# Patient Record
Sex: Female | Born: 1985 | Race: White | Hispanic: No | Marital: Married | Smoking: Former smoker
Health system: Southern US, Community
[De-identification: ages and names within clinical notes are randomized; demographics above are authoritative.]

## PROBLEM LIST (undated history)

## (undated) DIAGNOSIS — T7840XA Allergy, unspecified, initial encounter: Secondary | ICD-10-CM

## (undated) DIAGNOSIS — D649 Anemia, unspecified: Secondary | ICD-10-CM

## (undated) DIAGNOSIS — F909 Attention-deficit hyperactivity disorder, unspecified type: Secondary | ICD-10-CM

## (undated) DIAGNOSIS — F419 Anxiety disorder, unspecified: Secondary | ICD-10-CM

## (undated) DIAGNOSIS — O24419 Gestational diabetes mellitus in pregnancy, unspecified control: Secondary | ICD-10-CM

## (undated) DIAGNOSIS — F32A Depression, unspecified: Secondary | ICD-10-CM

## (undated) HISTORY — DX: Gestational diabetes mellitus in pregnancy, unspecified control: O24.419

## (undated) HISTORY — DX: Anemia, unspecified: D64.9

## (undated) HISTORY — PX: WISDOM TOOTH EXTRACTION: SHX21

## (undated) HISTORY — DX: Attention-deficit hyperactivity disorder, unspecified type: F90.9

## (undated) HISTORY — DX: Allergy, unspecified, initial encounter: T78.40XA

## (undated) HISTORY — PX: COSMETIC SURGERY: SHX468

---

## 2016-04-07 DIAGNOSIS — G8929 Other chronic pain: Secondary | ICD-10-CM | POA: Insufficient documentation

## 2016-04-07 DIAGNOSIS — G542 Cervical root disorders, not elsewhere classified: Secondary | ICD-10-CM | POA: Insufficient documentation

## 2016-04-07 DIAGNOSIS — F411 Generalized anxiety disorder: Secondary | ICD-10-CM | POA: Insufficient documentation

## 2017-10-28 DIAGNOSIS — F3342 Major depressive disorder, recurrent, in full remission: Secondary | ICD-10-CM | POA: Insufficient documentation

## 2017-10-28 DIAGNOSIS — F339 Major depressive disorder, recurrent, unspecified: Secondary | ICD-10-CM | POA: Insufficient documentation

## 2019-08-30 ENCOUNTER — Other Ambulatory Visit: Payer: Self-pay

## 2019-08-30 DIAGNOSIS — Z20822 Contact with and (suspected) exposure to covid-19: Secondary | ICD-10-CM

## 2019-09-01 LAB — NOVEL CORONAVIRUS, NAA: SARS-CoV-2, NAA: NOT DETECTED

## 2020-02-18 ENCOUNTER — Ambulatory Visit: Payer: Medicaid Other | Attending: Internal Medicine

## 2020-02-18 DIAGNOSIS — Z23 Encounter for immunization: Secondary | ICD-10-CM | POA: Insufficient documentation

## 2020-02-18 NOTE — Progress Notes (Signed)
   Covid-19 Vaccination Clinic  Name:  Susan Suarez    MRN: [id number] DOB: 1985-12-31  02/18/2020  Susan Suarez was observed post Covid-19 immunization for 15 minutes without incidence. She was provided with Vaccine Information Sheet and instruction to access the V-Safe system.   Susan Suarez was instructed to call 911 with any severe reactions post vaccine: Marland Kitchen Difficulty breathing  . Swelling of your face and throat  . A fast heartbeat  . A bad rash all over your body  . Dizziness and weakness    Immunizations Administered    Name Date Dose VIS Date Route   Pfizer COVID-19 Vaccine 02/18/2020  3:36 PM 0.3 mL 12/02/2019 Intramuscular   Manufacturer: [place]   Lot: [id number]   [place]: [id number]

## 2020-03-10 ENCOUNTER — Ambulatory Visit: Payer: Medicaid Other | Attending: Internal Medicine

## 2020-03-10 DIAGNOSIS — Z23 Encounter for immunization: Secondary | ICD-10-CM

## 2020-03-10 NOTE — Progress Notes (Signed)
   Covid-19 Vaccination Clinic  Name:  Susan Suarez    MRN: [id number] DOB: Nov 27, 1986  03/10/2020  [name] was observed post Covid-19 immunization for 15 minutes without incident. She was provided with Vaccine Information Sheet and instruction to access the V-Safe system.   [name] was instructed to call 911 with any severe reactions post vaccine: Marland Kitchen Difficulty breathing  . Swelling of face and throat  . A fast heartbeat  . A bad rash all over body  . Dizziness and weakness   Immunizations Administered    Name Date Dose VIS Date Route   Pfizer COVID-19 Vaccine 03/10/2020  8:42 AM 0.3 mL 12/02/2019 Intramuscular   Manufacturer: [place]   Lot: [id number]   [place]: [id number]

## 2020-10-19 ENCOUNTER — Other Ambulatory Visit: Payer: Medicaid Other

## 2020-10-19 DIAGNOSIS — Z20822 Contact with and (suspected) exposure to covid-19: Secondary | ICD-10-CM

## 2020-10-20 LAB — NOVEL CORONAVIRUS, NAA: SARS-CoV-2, NAA: NOT DETECTED

## 2020-10-20 LAB — SARS-COV-2, NAA 2 DAY TAT

## 2020-12-13 ENCOUNTER — Other Ambulatory Visit: Payer: Medicaid Other

## 2021-02-12 ENCOUNTER — Encounter: Payer: Self-pay | Admitting: Radiology

## 2021-02-24 ENCOUNTER — Inpatient Hospital Stay (HOSPITAL_COMMUNITY)
Admission: AD | Admit: 2021-02-24 | Discharge: 2021-02-24 | Disposition: A | Discharge status: Home / Self Care | Payer: 59 | Attending: Obstetrics and Gynecology | Admitting: Obstetrics and Gynecology

## 2021-02-24 ENCOUNTER — Other Ambulatory Visit: Payer: Self-pay

## 2021-02-24 ENCOUNTER — Inpatient Hospital Stay (HOSPITAL_COMMUNITY): Payer: 59

## 2021-02-24 ENCOUNTER — Encounter (HOSPITAL_COMMUNITY): Payer: Self-pay

## 2021-02-24 DIAGNOSIS — O4691 Antepartum hemorrhage, unspecified, first trimester: Secondary | ICD-10-CM

## 2021-02-24 DIAGNOSIS — R109 Unspecified abdominal pain: Secondary | ICD-10-CM | POA: Insufficient documentation

## 2021-02-24 DIAGNOSIS — Z79899 Other long term (current) drug therapy: Secondary | ICD-10-CM | POA: Insufficient documentation

## 2021-02-24 DIAGNOSIS — Z3A01 Less than 8 weeks gestation of pregnancy: Secondary | ICD-10-CM | POA: Diagnosis not present

## 2021-02-24 DIAGNOSIS — O26891 Other specified pregnancy related conditions, first trimester: Secondary | ICD-10-CM | POA: Insufficient documentation

## 2021-02-24 DIAGNOSIS — O09521 Supervision of elderly multigravida, first trimester: Secondary | ICD-10-CM | POA: Insufficient documentation

## 2021-02-24 DIAGNOSIS — O3680X0 Pregnancy with inconclusive fetal viability, not applicable or unspecified: Secondary | ICD-10-CM | POA: Diagnosis not present

## 2021-02-24 DIAGNOSIS — O209 Hemorrhage in early pregnancy, unspecified: Secondary | ICD-10-CM

## 2021-02-24 HISTORY — DX: Depression, unspecified: F32.A

## 2021-02-24 HISTORY — DX: Anxiety disorder, unspecified: F41.9

## 2021-02-24 LAB — WET PREP, GENITAL
Clue Cells Wet Prep HPF POC: NONE SEEN
Sperm: NONE SEEN
Trich, Wet Prep: NONE SEEN
Yeast Wet Prep HPF POC: NONE SEEN

## 2021-02-24 LAB — HIV ANTIBODY (ROUTINE TESTING W REFLEX): HIV Screen 4th Generation wRfx: NONREACTIVE

## 2021-02-24 LAB — CBC
HCT: 41.6 % (ref 36.0–46.0)
Hemoglobin: 13.8 g/dL (ref 12.0–15.0)
MCH: 30.7 pg (ref 26.0–34.0)
MCHC: 33.2 g/dL (ref 30.0–36.0)
MCV: 92.4 fL (ref 80.0–100.0)
Platelets: 245 10*3/uL (ref 150–400)
RBC: 4.5 MIL/uL (ref 3.87–5.11)
RDW: 11.8 % (ref 11.5–15.5)
WBC: 7.4 10*3/uL (ref 4.0–10.5)
nRBC: 0 % (ref 0.0–0.2)

## 2021-02-24 LAB — URINALYSIS, ROUTINE W REFLEX MICROSCOPIC
Bacteria, UA: NONE SEEN
Bilirubin Urine: NEGATIVE
Glucose, UA: NEGATIVE mg/dL
Ketones, ur: 5 mg/dL — AB
Nitrite: NEGATIVE
Protein, ur: NEGATIVE mg/dL
Specific Gravity, Urine: 1.002 — ABNORMAL LOW (ref 1.005–1.030)
pH: 7 (ref 5.0–8.0)

## 2021-02-24 LAB — HCG, QUANTITATIVE, PREGNANCY: hCG, Beta Chain, Quant, S: 3133 m[IU]/mL — ABNORMAL HIGH (ref <5)

## 2021-02-24 NOTE — MAU Provider Note (Signed)
Chief Complaint: Vaginal Bleeding and Abdominal Pain   Event Date/Time   First Provider Initiated Contact with Patient 02/24/21 1115      SUBJECTIVE HPI: Susan Suarez is a 35 y.o. G2P1001 at [redacted]w[redacted]d by LMP who presents to maternity admissions reporting onset of light red spotting with some associated abdominal cramping. The bleeding started yesterday and was brown, then became more red today. It is light, only noted when wiping and has not required a pad.     Location: lower abdomen Quality: cramping Severity: 2/10 on pain scale Duration: 1 day Timing: intermittent Modifying factors: none Associated signs and symptoms: vaginal bleeding  HPI  Past Medical History:  Diagnosis Date  . Anxiety   . Depression    Past Surgical History:  Procedure Laterality Date  . CESAREAN SECTION    . WISDOM TOOTH EXTRACTION     Social History   Socioeconomic History  . Marital status: Married    Spouse name: Not on file  . Number of children: Not on file  . Years of education: Not on file  . Highest education level: Not on file  Occupational History  . Not on file  Tobacco Use  . Smoking status: Not on file  . Smokeless tobacco: Not on file  Substance and Sexual Activity  . Alcohol use: Not on file  . Drug use: Not on file  . Sexual activity: Not on file  Other Topics Concern  . Not on file  Social History Narrative  . Not on file   Social Determinants of Health   Financial Resource Strain: Not on file  Food Insecurity: Not on file  Transportation Needs: Not on file  Physical Activity: Not on file  Stress: Not on file  Social Connections: Not on file  Intimate Partner Violence: Not on file   No current facility-administered medications on file prior to encounter.   Current Outpatient Medications on File Prior to Encounter  Medication Sig Dispense Refill  . DULoxetine (CYMBALTA) 60 MG capsule Take 60 mg by mouth daily.    . fexofenadine (ALLEGRA ODT) 30 MG  disintegrating tablet Take 30 mg by mouth daily.    . hydrOXYzine (ATARAX/VISTARIL) 25 MG tablet Take 25 mg by mouth 3 (three) times daily as needed.    . Prenatal Vit-Fe Fumarate-FA (MULTIVITAMIN-PRENATAL) 27-0.8 MG TABS tablet Take 1 tablet by mouth daily at 12 noon.     No Known Allergies  ROS:  Review of Systems  Constitutional: Negative for chills, fatigue and fever.  Respiratory: Negative for shortness of breath.   Cardiovascular: Negative for chest pain.  Gastrointestinal: Positive for abdominal pain. Negative for nausea and vomiting.  Genitourinary: Positive for vaginal bleeding. Negative for difficulty urinating, dysuria, flank pain, pelvic pain, vaginal discharge and vaginal pain.  Neurological: Negative for dizziness and headaches.  Psychiatric/Behavioral: Negative.      I have reviewed patient's Past Medical Hx, Surgical Hx, Family Hx, Social Hx, medications and allergies.   Physical Exam   Patient Vitals for the past 24 hrs:  BP Temp Temp src Pulse Resp SpO2 Height Weight  02/24/21 1343 114/69 98.3 F (36.8 C) Oral 88 16 98 % -- --  02/24/21 1016 115/62 98.4 F (36.9 C) Oral 80 16 97 % -- --  02/24/21 0946 118/74 98.3 F (36.8 C) Oral 80 16 98 % -- --  02/24/21 0942 -- -- -- -- -- -- 5\' 2"  (1.575 m) 67.1 kg   Constitutional: Well-developed, well-nourished female in no acute distress.  Cardiovascular: normal rate Respiratory: normal effort GI: Abd soft, non-tender. Pos BS x 4 MS: Extremities nontender, no edema, normal ROM Neurologic: Alert and oriented x 4.  GU: Neg CVAT.  PELVIC EXAM: Cervix pink, visually closed, without lesion, scant brown bleeding only, vaginal walls and external genitalia normal Bimanual exam: Cervix 0/long/high, firm, anterior, neg CMT, uterus nontender, nonenlarged, adnexa without tenderness, enlargement, or mass   LAB RESULTS Results for orders placed or performed during the hospital encounter of 02/24/21 (from the past 24 hour(s))   Urinalysis, Routine w reflex microscopic Urine, Clean Catch     Status: Abnormal   Collection Time: 02/24/21 10:54 AM  Result Value Ref Range   Color, Urine STRAW (A) YELLOW   APPearance CLEAR CLEAR   Specific Gravity, Urine 1.002 (L) 1.005 - 1.030   pH 7.0 5.0 - 8.0   Glucose, UA NEGATIVE NEGATIVE mg/dL   Hgb urine dipstick MODERATE (A) NEGATIVE   Bilirubin Urine NEGATIVE NEGATIVE   Ketones, ur 5 (A) NEGATIVE mg/dL   Protein, ur NEGATIVE NEGATIVE mg/dL   Nitrite NEGATIVE NEGATIVE   Leukocytes,Ua SMALL (A) NEGATIVE   RBC / HPF 0-5 0 - 5 RBC/hpf   WBC, UA 0-5 0 - 5 WBC/hpf   Bacteria, UA NONE SEEN NONE SEEN   Squamous Epithelial / LPF 0-5 0 - 5  CBC     Status: None   Collection Time: 02/24/21 11:04 AM  Result Value Ref Range   WBC 7.4 4.0 - 10.5 K/uL   RBC 4.50 3.87 - 5.11 MIL/uL   Hemoglobin 13.8 12.0 - 15.0 g/dL   HCT 41.6 36.0 - 46.0 %   MCV 92.4 80.0 - 100.0 fL   MCH 30.7 26.0 - 34.0 pg   MCHC 33.2 30.0 - 36.0 g/dL   RDW 11.8 11.5 - 15.5 %   Platelets 245 150 - 400 K/uL   nRBC 0.0 0.0 - 0.2 %  hCG, quantitative, pregnancy     Status: Abnormal   Collection Time: 02/24/21 11:04 AM  Result Value Ref Range   hCG, Beta Chain, Quant, S 3,133 (H) <5 mIU/mL  HIV Antibody (routine testing w rflx)     Status: None   Collection Time: 02/24/21 11:04 AM  Result Value Ref Range   HIV Screen 4th Generation wRfx Non Reactive Non Reactive  ABO/Rh     Status: None   Collection Time: 02/24/21 11:04 AM  Result Value Ref Range   ABO/RH(D)      A POS Performed at [facility], [address] [place], [place] [address]   Wet prep, genital     Status: Abnormal   Collection Time: 02/24/21 11:07 AM   Specimen: Vaginal  Result Value Ref Range   Yeast Wet Prep HPF POC NONE SEEN NONE SEEN   Trich, Wet Prep NONE SEEN NONE SEEN   Clue Cells Wet Prep HPF POC NONE SEEN NONE SEEN   WBC, Wet Prep HPF POC MANY (A) NONE SEEN   Sperm NONE SEEN     --/--/A POS Performed at [facility], [place] [address], [place], [place] [address]  [phone])  IMAGING US OB LESS THAN 14 WEEKS WITH OB TRANSVAGINAL  Result Date: 02/24/2021 CLINICAL DATA:  Pregnant patient with vaginal bleeding and pain. EXAM: OBSTETRIC <14 WK [place] AND TRANSVAGINAL OB US TECHNIQUE: Both transabdominal and transvaginal ultrasound examinations were performed for complete evaluation of the gestation as well as the maternal uterus, adnexal regions, and pelvic cul-de-sac. Transvaginal  technique was performed to assess early pregnancy. COMPARISON:  None. FINDINGS: Intrauterine gestational sac: Single visualized. Yolk sac:  Structure thought to be a yolk sac is seen on image 35. Embryo:  Mild visualized. Cardiac Activity: Not applicable. MSD: 5.5 mm   5 w   2 d Subchorionic hemorrhage:  None visualized. Maternal uterus/adnexae: Normal. IMPRESSION: Findings most compatible with early IUP.  No acute abnormality. Electronically Signed   By: Inge Rise M.D.   On: 02/24/2021 13:14    MAU Management/MDM: Orders Placed This Encounter  Procedures  . Wet prep, genital  . US OB LESS THAN 14 WEEKS WITH OB TRANSVAGINAL  . Urinalysis, Routine w reflex microscopic Urine, Clean Catch  . CBC  . hCG, quantitative, pregnancy  . HIV Antibody (routine testing w rflx)  . ABO/Rh  . Discharge patient    No orders of the defined types were placed in this encounter.   Findings today could represent a normal early pregnancy, spontaneous abortion or ectopic pregnancy which can be life-threatening.  Ectopic precautions were given to the patient with plan to return in 48 hours for repeat quant hcg to evaluate pregnancy development.  There was a probable yolk sac on [place] today but not definitive so pt to f/u at [facility] on Tuesday at 10:00 am for stat hcg.  Return to MAU sooner as needed for worsening symptoms.    ASSESSMENT 1. Pregnancy of unknown anatomic location   2. Vaginal bleeding in pregnancy, first trimester   3. [redacted] weeks  gestation of pregnancy     PLAN Discharge home Allergies as of 02/24/2021   No Known Allergies     Medication List    TAKE these medications   DULoxetine 60 MG capsule Commonly known as: CYMBALTA Take 60 mg by mouth daily.   fexofenadine 30 MG disintegrating tablet Commonly known as: ALLEGRA ODT Take 30 mg by mouth daily.   hydrOXYzine 25 MG tablet Commonly known as: ATARAX/VISTARIL Take 25 mg by mouth 3 (three) times daily as needed.   multivitamin-prenatal 27-0.8 MG Tabs tablet Take 1 tablet by mouth daily at 12 noon.       Follow-up [place] for [facility] Follow up.   Specialty: Obstetrics and Gynecology Why: Follow up in 2 days, on 02/26/21, for repeat labs, return to MAU as needed for emergencies. Contact information: [place] Certified Nurse-Midwife 02/24/2021  6:32 PM

## 2021-02-24 NOTE — MAU Note (Signed)
Susan Suarez is a 35 y.o. at [redacted]w[redacted]d here in MAU reporting: started having brown discharge last night and then this AM saw a clot. The clot was bright red. Having some abdominal cramping.   LMP: 01/10/21  Onset of complaint: last night  Pain score: 2/10  Vitals:   02/24/21 0946  BP: 118/74  Pulse: 80  Resp: 16  Temp: 98.3 F (36.8 C)  SpO2: 98%     Lab orders placed from triage: UA

## 2021-02-25 LAB — GC/CHLAMYDIA PROBE AMP (~~LOC~~) NOT AT ARMC
Chlamydia: NEGATIVE
Comment: NEGATIVE
Comment: NORMAL
Neisseria Gonorrhea: NEGATIVE

## 2021-02-25 LAB — ABO/RH: ABO/RH(D): A POS

## 2021-02-26 ENCOUNTER — Telehealth: Payer: Self-pay

## 2021-02-26 ENCOUNTER — Other Ambulatory Visit: Payer: Self-pay

## 2021-02-26 ENCOUNTER — Ambulatory Visit (INDEPENDENT_AMBULATORY_CARE_PROVIDER_SITE_OTHER): Payer: 59 | Admitting: Obstetrics and Gynecology

## 2021-02-26 ENCOUNTER — Telehealth: Payer: Self-pay | Admitting: *Deleted

## 2021-02-26 ENCOUNTER — Other Ambulatory Visit: Payer: 59

## 2021-02-26 DIAGNOSIS — O039 Complete or unspecified spontaneous abortion without complication: Secondary | ICD-10-CM | POA: Diagnosis not present

## 2021-02-26 DIAGNOSIS — O3680X0 Pregnancy with inconclusive fetal viability, not applicable or unspecified: Secondary | ICD-10-CM

## 2021-02-26 DIAGNOSIS — O200 Threatened abortion: Secondary | ICD-10-CM

## 2021-02-26 DIAGNOSIS — O09521 Supervision of elderly multigravida, first trimester: Secondary | ICD-10-CM | POA: Diagnosis not present

## 2021-02-26 DIAGNOSIS — O209 Hemorrhage in early pregnancy, unspecified: Secondary | ICD-10-CM

## 2021-02-26 LAB — BETA HCG QUANT (REF LAB): hCG Quant: 773 m[IU]/mL

## 2021-02-26 NOTE — Progress Notes (Signed)
Follow up from MAU visit

## 2021-02-26 NOTE — Telephone Encounter (Signed)
Susan Suarez with [employer] called the office with the stat hcg levels. HCG Level is: 773

## 2021-02-26 NOTE — Telephone Encounter (Signed)
Pt informed of HCG resutls

## 2021-02-27 NOTE — Progress Notes (Signed)
Obstetrics and Gynecology Visit Return Patient Evaluation  Appointment Date: 02/26/2021  Primary Care Provider: [facility]: [facility] for [facility]  Chief Complaint: f/u MAU visit  History of Present Illness:  Susan Suarez is a 35 y.o. G2P1 with above CC. Patient here for 48h beta but added on for provider visit given patient questions, concerns.  Patient seen on 3/6 for VB in early pregnancy; she had a +UPT at her PCP on 2/21. She had not seen an OB provider before presenting to the MAU. Normal CBC, u/a, hiv, wet prep, gc/ct with quant of 3133 and u/s that showed a likely YS with MSD c/w 5wks 2days. A POS   Interval History: Since that time, she states that she has increased VB, ?passing tissue. No systemic s/s.   Review of Systems: as noted in the History of Present Illness.  There are no problems to display for this patient.  Medications:  Susan Suarez had no medications administered during this visit. Current Outpatient Medications  Medication Sig Dispense Refill  . DULoxetine (CYMBALTA) 60 MG capsule Take 60 mg by mouth daily.    . fexofenadine (ALLEGRA ODT) 30 MG disintegrating tablet Take 30 mg by mouth daily.    . hydrOXYzine (ATARAX/VISTARIL) 25 MG tablet Take 25 mg by mouth 3 (three) times daily as needed.    . Prenatal Vit-Fe Fumarate-FA (MULTIVITAMIN-PRENATAL) 27-0.8 MG TABS tablet Take 1 tablet by mouth daily at 12 noon.     No current facility-administered medications for this visit.    Allergies: has No Known Allergies.  Physical Exam:  LMP 01/10/2021  There is no height or weight on file to calculate BMI. General appearance: Well nourished, well developed female in no acute distress.  Abdomen: diffusely non tender to palpation, non distended, and no masses, hernias Neuro/Psych:  Normal mood and affect.    Pelvic exam:  EGBUS: old blood at the introitus  TVUS negative. Previously seen sac not  present   Assessment: s/p completed SAB  Plan:  1. Threatened abortion  2. Miscarriage D/w her re: SAB and that most likely due to being AMA and increased risk of early miscarriage with AMA. I told her that she should expct a period in the next 1-75m and if not to take a home UPT and let us know the results  I reivewed the MAU u/s images and I see a YS so she had an IUP so no need to follow serial quants. Quant today already sent to lab. Will follow up.    RTC: PRN  Durene Romans MD Attending Center for [facility])

## 2021-02-27 NOTE — Procedures (Signed)
Transvaginal Ultrasound Procedure Note  Pre-operative Diagnosis: Threatened AB in early pregnancy  Post-operative Diagnosis: SAB   Procedure Details  The patient was placed in the dorsal lithotomy position.  Old blood on the vulva. Transvaginal probe placed  Uterus: midplane. 6.9 x 3.2 x 4.1cm. Endometrial stripe: 53mm, thin, avascular, homogenous. Right ovary: 2.3 x 3 x 2cm, two sub centimeter follicles Left ovary: 3.3 x 2.5 x 1.7cm  No free fluid in the pelvis  Complications: None  Plan: Previously seen 5wk GS with YS is not seen c/w completed miscarriage  Durene Romans MD Attending Center for [facility])

## 2021-03-28 ENCOUNTER — Encounter: Payer: 59 | Admitting: Family Medicine

## 2021-05-14 DIAGNOSIS — L700 Acne vulgaris: Secondary | ICD-10-CM | POA: Insufficient documentation

## 2021-05-14 DIAGNOSIS — L719 Rosacea, unspecified: Secondary | ICD-10-CM | POA: Insufficient documentation

## 2021-09-10 ENCOUNTER — Encounter: Payer: Self-pay | Admitting: *Deleted

## 2021-10-09 ENCOUNTER — Telehealth: Payer: Self-pay

## 2021-10-09 NOTE — Telephone Encounter (Signed)
Called the pt to move the appt

## 2021-10-11 ENCOUNTER — Other Ambulatory Visit: Payer: Self-pay

## 2021-10-11 ENCOUNTER — Inpatient Hospital Stay (HOSPITAL_COMMUNITY): Payer: Medicaid Other

## 2021-10-11 ENCOUNTER — Inpatient Hospital Stay (HOSPITAL_COMMUNITY)
Admission: AD | Admit: 2021-10-11 | Discharge: 2021-10-11 | Disposition: A | Discharge status: Home / Self Care | Payer: Medicaid Other | Attending: Obstetrics and Gynecology | Admitting: Obstetrics and Gynecology

## 2021-10-11 ENCOUNTER — Encounter (HOSPITAL_COMMUNITY): Payer: Self-pay | Admitting: Obstetrics and Gynecology

## 2021-10-11 DIAGNOSIS — O209 Hemorrhage in early pregnancy, unspecified: Secondary | ICD-10-CM

## 2021-10-11 DIAGNOSIS — O09291 Supervision of pregnancy with other poor reproductive or obstetric history, first trimester: Secondary | ICD-10-CM | POA: Diagnosis not present

## 2021-10-11 DIAGNOSIS — O200 Threatened abortion: Secondary | ICD-10-CM | POA: Diagnosis not present

## 2021-10-11 DIAGNOSIS — O09521 Supervision of elderly multigravida, first trimester: Secondary | ICD-10-CM | POA: Diagnosis not present

## 2021-10-11 DIAGNOSIS — Z3A09 9 weeks gestation of pregnancy: Secondary | ICD-10-CM | POA: Diagnosis not present

## 2021-10-11 LAB — URINALYSIS, ROUTINE W REFLEX MICROSCOPIC
Bacteria, UA: NONE SEEN
Bilirubin Urine: NEGATIVE
Glucose, UA: NEGATIVE mg/dL
Ketones, ur: NEGATIVE mg/dL
Leukocytes,Ua: NEGATIVE
Nitrite: NEGATIVE
Protein, ur: NEGATIVE mg/dL
RBC / HPF: >50 RBC/hpf — ABNORMAL HIGH (ref 0–5)
Specific Gravity, Urine: 1.015 (ref 1.005–1.030)
pH: 7 (ref 5.0–8.0)

## 2021-10-11 LAB — CBC
HCT: 41.4 % (ref 36.0–46.0)
Hemoglobin: 13.7 g/dL (ref 12.0–15.0)
MCH: 30.4 pg (ref 26.0–34.0)
MCHC: 33.1 g/dL (ref 30.0–36.0)
MCV: 92 fL (ref 80.0–100.0)
Platelets: 247 10*3/uL (ref 150–400)
RBC: 4.5 MIL/uL (ref 3.87–5.11)
RDW: 12.1 % (ref 11.5–15.5)
WBC: 6.4 10*3/uL (ref 4.0–10.5)
nRBC: 0 % (ref 0.0–0.2)

## 2021-10-11 LAB — POCT PREGNANCY, URINE: Preg Test, Ur: POSITIVE — AB

## 2021-10-11 LAB — HCG, QUANTITATIVE, PREGNANCY: hCG, Beta Chain, Quant, S: 31548 m[IU]/mL — ABNORMAL HIGH (ref <5)

## 2021-10-11 NOTE — MAU Provider Note (Signed)
History     CSN: [id number]  Arrival date and time: 10/11/21 2951   Event Date/Time   First Provider Initiated Contact with Patient 10/11/21 [phone]      Chief Complaint  Patient presents with   Vaginal Bleeding   HPI Susan Suarez is a 35 y.o. G3P1011 at [redacted]w[redacted]d who presents with vaginal bleeding. Symptoms started Wednesday with brown discharge. Yesterday bleeding increased & was red. Not bleeding into a pad. Did pass 1 small blood clots yesterday. Denies abdominal pain. Concerned due to miscarriage earlier this year.   OB History     Gravida  3   Para  1   Term  1   Preterm      AB  1   Living  1      SAB  1   IAB      Ectopic      Multiple      Live Births  1           Past Medical History:  Diagnosis Date   Anxiety    Depression     Past Surgical History:  Procedure Laterality Date   CESAREAN SECTION     WISDOM TOOTH EXTRACTION      No family history on file.  Social History   Tobacco Use   Smoking status: Former    Types: Cigarettes    Quit date: 09/21/2018    Years since quitting: 3.0   Smokeless tobacco: Never  Vaping Use   Vaping Use: Never used  Substance Use Topics   Alcohol use: Not Currently   Drug use: Not Currently    Allergies: No Known Allergies  Medications Prior to Admission  Medication Sig Dispense Refill Last Dose   cetirizine (ZYRTEC) 10 MG tablet Take 10 mg by mouth daily.      DULoxetine (CYMBALTA) 60 MG capsule Take 60 mg by mouth daily.   10/11/2021   fluticasone (FLONASE) 50 MCG/ACT nasal spray Place 1 spray into both nostrils daily.      hydrOXYzine (ATARAX/VISTARIL) 25 MG tablet Take 25 mg by mouth 3 (three) times daily as needed.   10/11/2021   fexofenadine (ALLEGRA ODT) 30 MG disintegrating tablet Take 30 mg by mouth daily.      Prenatal Vit-Fe Fumarate-FA (MULTIVITAMIN-PRENATAL) 27-0.8 MG TABS tablet Take 1 tablet by mouth daily at 12 noon.       Review of Systems  Constitutional: Negative.    Gastrointestinal: Negative.   Genitourinary:  Positive for vaginal bleeding.  Physical Exam   Blood pressure 120/78, pulse 98, temperature 98.4 F (36.9 C), resp. rate 18, height 5\' 2"  (1.575 m), weight 62.1 kg, last menstrual period 08/07/2021, unknown if currently breastfeeding.  Physical Exam Vitals and nursing note reviewed. Exam conducted with a chaperone present.  Constitutional:      Appearance: Normal appearance. She is not ill-appearing.  HENT:     Head: Normocephalic and atraumatic.  Eyes:     General: No scleral icterus. Pulmonary:     Effort: Pulmonary effort is normal. No respiratory distress.  Abdominal:     General: Abdomen is flat. There is no distension.     Palpations: Abdomen is soft.     Tenderness: There is no abdominal tenderness.  Genitourinary:    Comments: Spec exam: NEFG. Small  amount of dark red mucoid blood. No abnormal discharge. Cervix anterior to patient's left; no lesions & not friable. Cervix closed.  Skin:    General: Skin is warm and dry.  Neurological:     General: No focal deficit present.     Mental Status: She is alert.  Psychiatric:        Mood and Affect: Mood normal.        Behavior: Behavior normal.    MAU Course  Procedures Results for orders placed or performed during the hospital encounter of 10/11/21 (from the past 24 hour(s))  Pregnancy, urine POC     Status: Abnormal   Collection Time: 10/11/21  9:32 AM  Result Value Ref Range   Preg Test, Ur POSITIVE (A) NEGATIVE  Urinalysis, Routine w reflex microscopic Urine, Clean Catch     Status: Abnormal   Collection Time: 10/11/21  9:38 AM  Result Value Ref Range   Color, Urine YELLOW YELLOW   APPearance HAZY (A) CLEAR   Specific Gravity, Urine 1.015 1.005 - 1.030   pH 7.0 5.0 - 8.0   Glucose, UA NEGATIVE NEGATIVE mg/dL   Hgb urine dipstick MODERATE (A) NEGATIVE   Bilirubin Urine NEGATIVE NEGATIVE   Ketones, ur NEGATIVE NEGATIVE mg/dL   Protein, ur NEGATIVE NEGATIVE mg/dL    Nitrite NEGATIVE NEGATIVE   Leukocytes,Ua NEGATIVE NEGATIVE   RBC / HPF >50 (H) 0 - 5 RBC/hpf   WBC, UA 6-10 0 - 5 WBC/hpf   Bacteria, UA NONE SEEN NONE SEEN   Squamous Epithelial / LPF 0-5 0 - 5   Mucus PRESENT   CBC     Status: None   Collection Time: 10/11/21 10:04 AM  Result Value Ref Range   WBC 6.4 4.0 - 10.5 K/uL   RBC 4.50 3.87 - 5.11 MIL/uL   Hemoglobin 13.7 12.0 - 15.0 g/dL   HCT 41.4 36.0 - 46.0 %   MCV 92.0 80.0 - 100.0 fL   MCH 30.4 26.0 - 34.0 pg   MCHC 33.1 30.0 - 36.0 g/dL   RDW 12.1 11.5 - 15.5 %   Platelets 247 150 - 400 K/uL   nRBC 0.0 0.0 - 0.2 %  hCG, quantitative, pregnancy     Status: Abnormal   Collection Time: 10/11/21 10:04 AM  Result Value Ref Range   hCG, Beta Chain, Quant, S 31,548 (H) <5 mIU/mL   US OB LESS THAN 14 WEEKS WITH OB TRANSVAGINAL  Result Date: 10/11/2021 CLINICAL DATA:  Bleeding for 24 hours EXAM: OBSTETRIC <14 WK [place] AND TRANSVAGINAL OB US TECHNIQUE: Both transabdominal and transvaginal ultrasound examinations were performed for complete evaluation of the gestation as well as the maternal uterus, adnexal regions, and pelvic cul-de-sac. Transvaginal technique was performed to assess early pregnancy. COMPARISON:  Obstetric ultrasound from a prior pregnancy 02/24/2021 FINDINGS: Intrauterine gestational sac: Single Yolk sac: Visualized. The yolk sac is prominent in size measuring up to 1.0 cm. Embryo:  Visualized. Cardiac Activity: Not Visualized. Heart Rate: N/a bpm CRL: 2.8 mm   5 w   5 d                  [place] EDC: 06/08/2022 Subchorionic hemorrhage:  None visualized. Maternal uterus/adnexae: A corpus luteum is noted on the right. The left ovary is unremarkable. IMPRESSION: Findings are suspicious but not yet definitive for failed pregnancy. Recommend follow-up US in 10-14 days for definitive diagnosis. This recommendation follows SRU consensus guidelines: Diagnostic Criteria for Nonviable Pregnancy Early in the First Trimester. [place] Med 2013;  220:2542-70. Electronically Signed   By: Valetta Mole M.D.   On: 10/11/2021 11:27    MDM +UPT UA, CBC, ABO/Rh, quant hCG, and  [place] today to rule out ectopic pregnancy which can be life threatening.   Patient presents with vaginal bleeding. Minimal bleeding on exam. She is RH positive.  Ultrasound today shows IUGS with enlarged yolk sac & 2.8 mm fetal pole, no cardiac activity. Finding concerning but not diagnostic of failed pregnancy. Discussed result with patient & recommend f/u ultrasound for diagnosis. Patient is agreeable with this plan but would like an HCG on Monday. Discussed with patient that HCG would only give [place] diagnosis if it starts dropping.  Assessment and Plan   1. Threatened miscarriage   2. Vaginal bleeding in pregnancy, first trimester    -bleeding precautions -pelvic rest -outpatient viability scan ordered -HCG scheduled Monday in the office (per patient's request, she would like to know if HCG is dropping)    Jorje Guild 10/11/2021, 9:53 AM

## 2021-10-11 NOTE — MAU Note (Signed)
Pt is 9 weeks.Pt stated she had brown discharge on Wed. Yesterday she had red bleeding and thought she passed some tissue.  Still has bleeding and cramping. Pt had miscarriage in March.

## 2021-10-11 NOTE — Discharge Instructions (Signed)
Return to care  If you have heavier bleeding that soaks through more than 2 pads per hour for an hour or more If you bleed so much that you feel like you might pass out or you do pass out If you have significant abdominal pain that is not improved with Tylenol   

## 2021-10-14 ENCOUNTER — Ambulatory Visit: Payer: Medicaid Other | Admitting: *Deleted

## 2021-10-14 ENCOUNTER — Other Ambulatory Visit: Payer: Self-pay

## 2021-10-14 ENCOUNTER — Telehealth: Payer: Self-pay | Admitting: *Deleted

## 2021-10-14 DIAGNOSIS — O209 Hemorrhage in early pregnancy, unspecified: Secondary | ICD-10-CM

## 2021-10-14 LAB — BETA HCG QUANT (REF LAB): hCG Quant: 3972 m[IU]/mL

## 2021-10-14 NOTE — Progress Notes (Signed)
Patient was assessed and managed by nursing staff during this encounter. I have reviewed the chart and agree with the documentation and plan. I have also made any necessary editorial changes.  The heavy VB was Friday night, after her MAU visit. Beta hcg 10% of what it was on 10/21. Recommend 2wk md in person f/u to see how she's doing. I don't believe she needs a f/u u/s to check for retained POCs as long as her VB is minimal and decreasing and same with pain.   Clinic RN at [facility] to call pt  Aletha Halim, MD 10/14/2021 2:01 PM

## 2021-10-14 NOTE — Telephone Encounter (Signed)
Called pt and informed her of the HCG results and this was  SAB. Follow up appointment made and bleeding precautions given.

## 2021-10-14 NOTE — Progress Notes (Signed)
Pt did have some heavy bleeding and cramping on Friday and believes she did miscarry. Will get her results and call her this afternoon with results and when she needs to come back in to follow  up.

## 2021-10-30 ENCOUNTER — Ambulatory Visit (INDEPENDENT_AMBULATORY_CARE_PROVIDER_SITE_OTHER): Payer: Medicaid Other | Admitting: Family Medicine

## 2021-10-30 ENCOUNTER — Encounter: Payer: Self-pay | Admitting: Family Medicine

## 2021-10-30 ENCOUNTER — Other Ambulatory Visit: Payer: Self-pay

## 2021-10-30 VITALS — BP 98/65 | HR 79 | Ht 62.0 in | Wt 136.0 lb

## 2021-10-30 DIAGNOSIS — N96 Recurrent pregnancy loss: Secondary | ICD-10-CM

## 2021-10-30 DIAGNOSIS — F32A Depression, unspecified: Secondary | ICD-10-CM | POA: Insufficient documentation

## 2021-10-30 DIAGNOSIS — D2370 Other benign neoplasm of skin of unspecified lower limb, including hip: Secondary | ICD-10-CM | POA: Insufficient documentation

## 2021-10-30 NOTE — Progress Notes (Signed)
Discuss 2 most recent SAB's establish care

## 2021-10-30 NOTE — Progress Notes (Signed)
   Subjective:    Patient ID: Susan Suarez is a 35 y.o. female presenting with Follow-up  on 10/30/2021  HPI: [id number] with 2 recent early SABs. Has not needed a procedure for completion. She is otherwise healthy. Her OB hx is significant for primary c-section following ECV that reverted to breech with AROM. Delivery by classical cesarean section of 9+lb baby girl.   Review of Systems  Constitutional:  Negative for chills and fever.  Respiratory:  Negative for shortness of breath.   Cardiovascular:  Negative for chest pain.  Gastrointestinal:  Negative for abdominal pain, nausea and vomiting.  Genitourinary:  Negative for dysuria.  Skin:  Negative for rash.     Objective:    BP 98/65   Pulse 79   Ht 5\' 2"  (1.575 m)   Wt 136 lb (61.7 kg)   BMI 24.87 kg/m  Physical Exam Constitutional:      General: She is not in acute distress.    Appearance: She is well-developed.  HENT:     Head: Normocephalic and atraumatic.  Eyes:     General: No scleral icterus. Cardiovascular:     Rate and Rhythm: Normal rate.  Pulmonary:     Effort: Pulmonary effort is normal.  Abdominal:     Palpations: Abdomen is soft.  Musculoskeletal:     Cervical back: Neck supple.  Skin:    General: Skin is warm and dry.  Neurological:     Mental Status: She is alert and oriented to person, place, and time.        Assessment & Plan:   Problem List Items Addressed This Visit       Unprioritized   History of recurrent miscarriages - Primary    Given recurrence--will pursue work-up to include HgbA1C, APLA w/u and TSH. Had u/s with early SAB and did not note uterine septum or bicornuate uterus--would pursue HSG and chromosomal anomalies of both parents should a 3rd miscarriage occur. She is not sure if emotionally she can handle another baby. On Cymbalta, may try to stop this, though stress is known cause of SAB. Also, discussed mindfulness, yoga, meditation, self care as alternatives. Discussed  age as a possible contributor, discussed usual miscarriage prevalence and causes at length.      Relevant Orders   TSH   Hemoglobin A1c   Antiphospholipid Syndrome Comp     Total time in review of prior notes, including operative notes around prior delivery, radiology, labs, history taking, review with patient, exam, note writing, discussion of options, plan for next steps, including work-up now and later, alternatives and risks of treatment: 32 minutes.  Return in about 6 weeks (around 12/11/2021).  Donnamae Jude 10/30/2021 3:59 PM

## 2021-10-31 DIAGNOSIS — N96 Recurrent pregnancy loss: Secondary | ICD-10-CM | POA: Insufficient documentation

## 2021-10-31 NOTE — Assessment & Plan Note (Signed)
Given recurrence--will pursue work-up to include HgbA1C, APLA w/u and TSH. Had u/s with early SAB and did not note uterine septum or bicornuate uterus--would pursue HSG and chromosomal anomalies of both parents should a 3rd miscarriage occur. She is not sure if emotionally she can handle another baby. On Cymbalta, may try to stop this, though stress is known cause of SAB. Also, discussed mindfulness, yoga, meditation, self care as alternatives. Discussed age as a possible contributor, discussed usual miscarriage prevalence and causes at length.

## 2021-11-07 LAB — ANTIPHOSPHOLIPID SYNDROME COMP
APTT: 29.2 s
Anticardiolipin Ab, IgA: <10 [APL'U]
Anticardiolipin Ab, IgG: <10 [GPL'U]
Anticardiolipin Ab, IgM: 14 [MPL'U]
Antiphosphatidylserine IgG: 5 {GPS'U}
Antiphosphatidylserine IgM: 3 {MPS'U}
Antiprothrombin Antibody, IgG: 2 G units
Beta-2 Glycoprotein I, IgA: <10 SAU
Beta-2 Glycoprotein I, IgG: <10 SGU
Beta-2 Glycoprotein I, IgM: <10 SMU
DRVVT Screen Seconds: 25 s
Hexagonal Phospholipid Neutral: 0 s
Platelet Neutralization: 0 s

## 2021-11-07 LAB — HEMOGLOBIN A1C
Est. average glucose Bld gHb Est-mCnc: 114 mg/dL
Hgb A1c MFr Bld: 5.6 % (ref 4.8–5.6)

## 2021-11-07 LAB — TSH: TSH: 0.763 u[IU]/mL (ref 0.450–4.500)

## 2021-12-18 ENCOUNTER — Other Ambulatory Visit: Payer: Self-pay

## 2021-12-18 ENCOUNTER — Ambulatory Visit (INDEPENDENT_AMBULATORY_CARE_PROVIDER_SITE_OTHER): Payer: Medicaid Other | Admitting: Family Medicine

## 2021-12-18 VITALS — BP 115/77 | HR 81 | Wt 141.0 lb

## 2021-12-18 DIAGNOSIS — N96 Recurrent pregnancy loss: Secondary | ICD-10-CM | POA: Diagnosis not present

## 2021-12-18 DIAGNOSIS — F3341 Major depressive disorder, recurrent, in partial remission: Secondary | ICD-10-CM | POA: Diagnosis not present

## 2021-12-18 DIAGNOSIS — F411 Generalized anxiety disorder: Secondary | ICD-10-CM | POA: Diagnosis not present

## 2021-12-18 MED ORDER — DULOXETINE HCL 40 MG PO CPEP: 40.0000 mg | ORAL_CAPSULE | Freq: Every day | ORAL | 2 refills | Status: DC

## 2021-12-18 NOTE — Progress Notes (Signed)
° °  Subjective:    Patient ID: Susan Suarez is a 35 y.o. female presenting with No chief complaint on file.  on 12/18/2021  HPI: Has had SAB x 2. Initial w/u was negative. Ready to try again.  LMP was 12/8. Thinks she might be pregnant.  Wants to decrease her depression meds as this may be contributing to miscarriage. Feels mood is fairly well controlled.   Review of Systems  Constitutional:  Negative for chills and fever.  Respiratory:  Negative for shortness of breath.   Cardiovascular:  Negative for chest pain.  Gastrointestinal:  Negative for abdominal pain, nausea and vomiting.  Genitourinary:  Negative for dysuria.  Skin:  Negative for rash.     Objective:    BP 115/77    Pulse 81    Wt 141 lb (64 kg)    BMI 25.79 kg/m  Physical Exam Constitutional:      General: She is not in acute distress.    Appearance: She is well-developed.  HENT:     Head: Normocephalic and atraumatic.  Eyes:     General: No scleral icterus. Cardiovascular:     Rate and Rhythm: Normal rate.  Pulmonary:     Effort: Pulmonary effort is normal.  Abdominal:     Palpations: Abdomen is soft.  Musculoskeletal:     Cervical back: Neck supple.  Skin:    General: Skin is warm and dry.  Neurological:     Mental Status: She is alert and oriented to person, place, and time.   UPT negative     Assessment & Plan:   Problem List Items Addressed This Visit       Unprioritized   Depression - Primary    Decrease Cymbalta with slow taper.      Relevant Medications   DULoxetine 40 MG CPEP   Generalized anxiety disorder    Decrease Cymbalta to 40 mg daily, in slow taper.      Relevant Medications   DULoxetine 40 MG CPEP   History of recurrent miscarriages    Will await menses and repeat UPT in a few weeks.       Return in about 4 weeks (around 01/15/2022).  Donnamae Jude 12/18/2021 4:36 PM

## 2021-12-19 NOTE — Assessment & Plan Note (Signed)
Decrease Cymbalta with slow taper.

## 2021-12-19 NOTE — Assessment & Plan Note (Signed)
Will await menses and repeat UPT in a few weeks.

## 2021-12-19 NOTE — Assessment & Plan Note (Signed)
Decrease Cymbalta to 40 mg daily, in slow taper.

## 2022-01-01 ENCOUNTER — Other Ambulatory Visit: Payer: Self-pay | Admitting: Family Medicine

## 2022-01-01 DIAGNOSIS — F3341 Major depressive disorder, recurrent, in partial remission: Secondary | ICD-10-CM

## 2022-01-01 MED ORDER — DULOXETINE HCL 20 MG PO CPEP: 20.0000 mg | ORAL_CAPSULE | Freq: Every day | ORAL | 1 refills | Status: DC

## 2022-01-01 NOTE — Progress Notes (Signed)
Decreased from 60 mg cymbalta to 40 mg x 2 weeks ago. Will decrease to 20 mg x 2 weeks , the 20 mg qod x 2 weeks, then stop.

## 2022-01-02 ENCOUNTER — Other Ambulatory Visit: Payer: Medicaid Other

## 2022-01-02 ENCOUNTER — Other Ambulatory Visit: Payer: Self-pay

## 2022-01-02 DIAGNOSIS — O4690 Antepartum hemorrhage, unspecified, unspecified trimester: Secondary | ICD-10-CM

## 2022-01-03 LAB — BETA HCG QUANT (REF LAB): hCG Quant: 2 m[IU]/mL

## 2022-01-31 ENCOUNTER — Other Ambulatory Visit: Payer: Self-pay | Admitting: Family Medicine

## 2022-01-31 DIAGNOSIS — F3341 Major depressive disorder, recurrent, in partial remission: Secondary | ICD-10-CM

## 2022-02-04 ENCOUNTER — Encounter: Payer: Self-pay | Admitting: Family Medicine

## 2022-02-04 ENCOUNTER — Ambulatory Visit (INDEPENDENT_AMBULATORY_CARE_PROVIDER_SITE_OTHER): Payer: Medicaid Other | Admitting: Family Medicine

## 2022-02-04 ENCOUNTER — Other Ambulatory Visit: Payer: Self-pay

## 2022-02-04 VITALS — BP 114/78 | HR 78

## 2022-02-04 DIAGNOSIS — F411 Generalized anxiety disorder: Secondary | ICD-10-CM | POA: Diagnosis not present

## 2022-02-04 DIAGNOSIS — N96 Recurrent pregnancy loss: Secondary | ICD-10-CM | POA: Diagnosis not present

## 2022-02-04 DIAGNOSIS — F3342 Major depressive disorder, recurrent, in full remission: Secondary | ICD-10-CM

## 2022-02-04 MED ORDER — SERTRALINE HCL 50 MG PO TABS: 50.0000 mg | ORAL_TABLET | Freq: Every day | ORAL | 2 refills | Status: DC

## 2022-02-04 NOTE — Progress Notes (Signed)
° °  Subjective:    Patient ID: Susan Suarez is a 36 y.o. female presenting with Follow-up  on 02/04/2022  HPI: Has now had 3 pregnancy losses with normal initial w/u. Has tried to decrease her Cymbalta and now down to 20 mg and has been somewhat emotional. Has been tried on Zoloft in the past, but had been having diarrhea on the 200 mg. Willing to try again. We had discussed HSG, parental chromosomes in the past.  Review of Systems  Constitutional:  Negative for chills and fever.  Respiratory:  Negative for shortness of breath.   Cardiovascular:  Negative for chest pain.  Gastrointestinal:  Negative for abdominal pain, nausea and vomiting.  Genitourinary:  Negative for dysuria.  Skin:  Negative for rash.  Psychiatric/Behavioral:  Positive for dysphoric mood. The patient is nervous/anxious.      Objective:    BP 114/78    Pulse 78  Physical Exam Exam conducted with a chaperone present.  Constitutional:      General: She is not in acute distress.    Appearance: She is well-developed.  HENT:     Head: Normocephalic and atraumatic.  Eyes:     General: No scleral icterus. Cardiovascular:     Rate and Rhythm: Normal rate.  Pulmonary:     Effort: Pulmonary effort is normal.  Abdominal:     Palpations: Abdomen is soft.  Musculoskeletal:     Cervical back: Neck supple.  Skin:    General: Skin is warm and dry.  Neurological:     Mental Status: She is alert and oriented to person, place, and time.        Assessment & Plan:   Problem List Items Addressed This Visit       Unprioritized   Generalized anxiety disorder - Primary    Will add in low dose zoloft and then increase it in 4-6 weeks. May take with cymbalta with goal of decreasing this to off if possible. May reach back out to psych for further eval and treatment options.      Relevant Medications   sertraline (ZOLOFT) 50 MG tablet   Recurrent major depressive disorder, in full remission ([place])    See Anxiety  problem--tx is the same.      Relevant Medications   sertraline (ZOLOFT) 50 MG tablet   History of recurrent miscarriages    Agrees to HSG for now. Does not want referral to REI or parental chromosomes at this time.      Relevant Orders   DG Hysterogram (HSG)    Return in about 3 months (around 05/04/2022) for a CPE, will need HSG.  Donnamae Jude, MD 02/04/2022 8:36 AM

## 2022-02-04 NOTE — Assessment & Plan Note (Signed)
Will add in low dose zoloft and then increase it in 4-6 weeks. May take with cymbalta with goal of decreasing this to off if possible. May reach back out to psych for further eval and treatment options.

## 2022-02-04 NOTE — Assessment & Plan Note (Signed)
Agrees to HSG for now. Does not want referral to REI or parental chromosomes at this time.

## 2022-02-04 NOTE — Assessment & Plan Note (Signed)
See Anxiety problem--tx is the same.

## 2022-02-20 ENCOUNTER — Other Ambulatory Visit: Payer: Self-pay

## 2022-02-20 ENCOUNTER — Other Ambulatory Visit (INDEPENDENT_AMBULATORY_CARE_PROVIDER_SITE_OTHER): Payer: Medicaid Other

## 2022-02-20 DIAGNOSIS — Z3201 Encounter for pregnancy test, result positive: Secondary | ICD-10-CM | POA: Diagnosis not present

## 2022-02-20 LAB — POCT URINE PREGNANCY: Preg Test, Ur: POSITIVE — AB

## 2022-02-21 ENCOUNTER — Telehealth: Payer: Self-pay | Admitting: *Deleted

## 2022-02-21 LAB — BETA HCG QUANT (REF LAB): hCG Quant: 125 m[IU]/mL

## 2022-02-21 MED ORDER — PROGESTERONE 200 MG PO CAPS: 200.0000 mg | ORAL_CAPSULE | Freq: Every day | ORAL | 3 refills | Status: DC

## 2022-02-21 NOTE — Telephone Encounter (Signed)
Called pt and gave HCG results, and offered starting progesterone. Pt is okay to start, will send it to her pharmacy. ?

## 2022-03-01 IMAGING — US US OB < 14 WEEKS - US OB TV
1 series · 15 of 28 positions shown · non-contrast
Comparison: None.

CLINICAL DATA: Pregnant patient with vaginal bleeding and pain.

EXAM:
OBSTETRIC <14 WK US AND TRANSVAGINAL OB US
TECHNIQUE: Both transabdominal and transvaginal ultrasound examinations were
performed for complete evaluation of the gestation as well as the
maternal uterus, adnexal regions, and pelvic cul-de-sac.
Transvaginal technique was performed to assess early pregnancy.

[Series 1: us ob < 14 weeks - us ob tv · 15 of 54 slices shown]
[im 1/54]
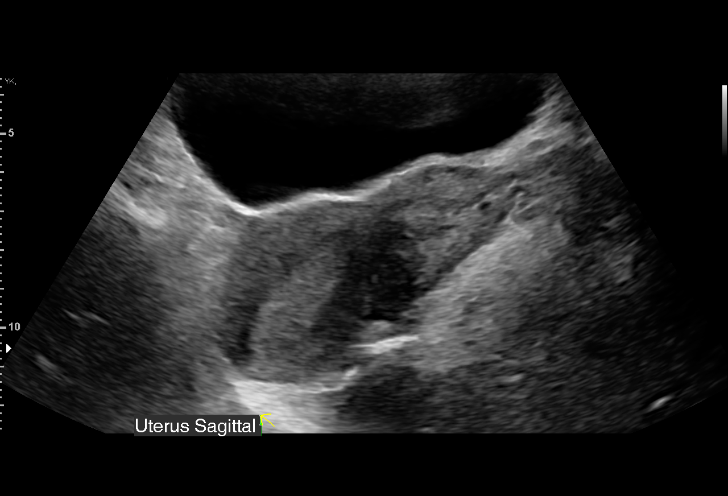
[im 4/54]
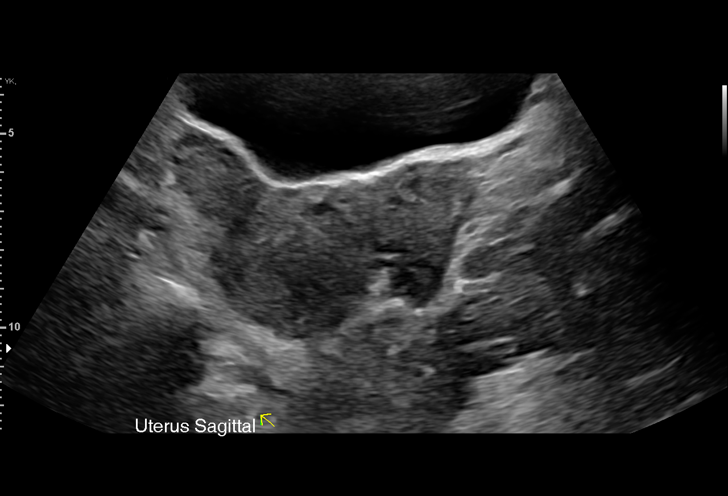
[im 8/54]
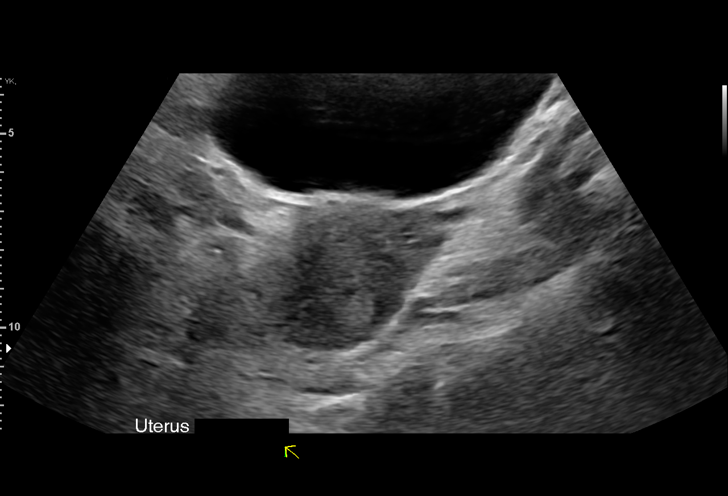
[im 12/54]
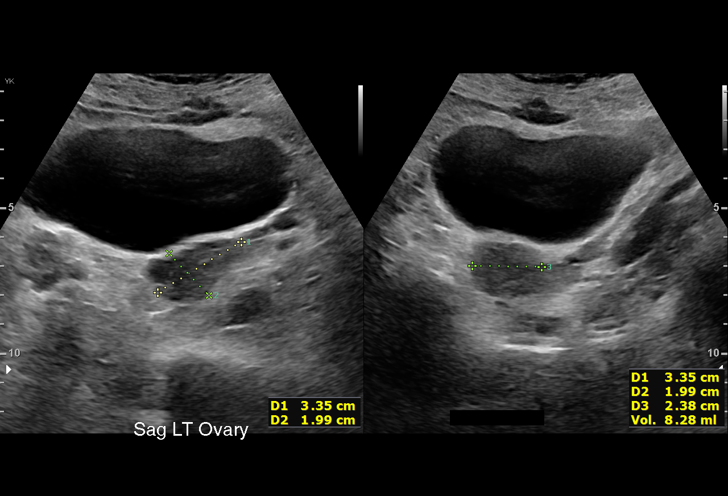
[im 16/54]
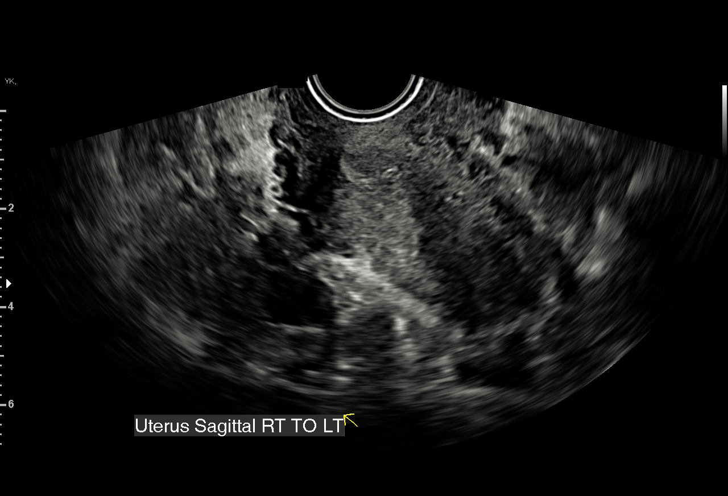
[im 20/54]
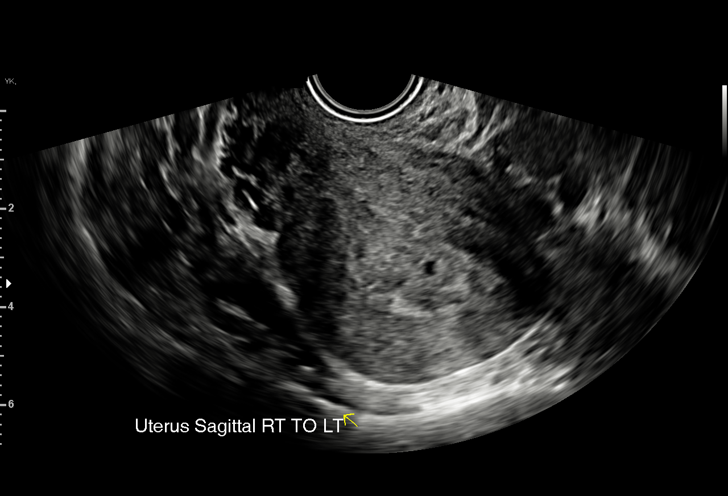
[im 24/54]
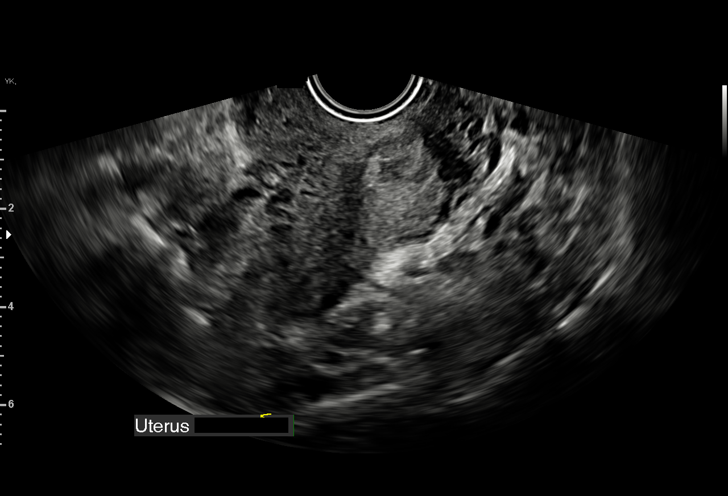
[im 28/54]
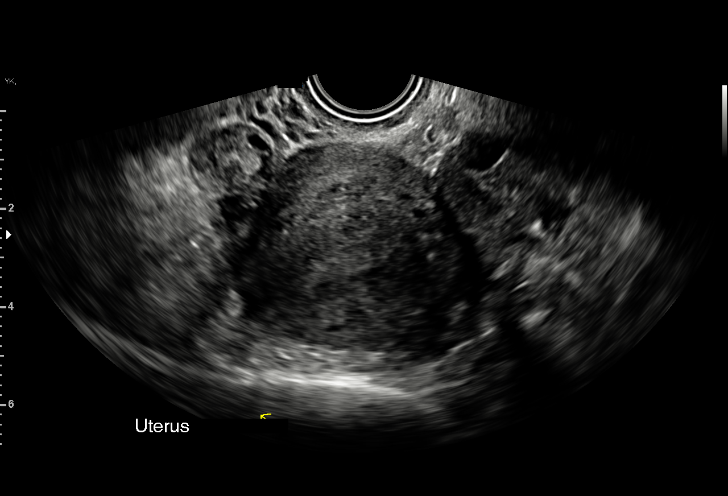
[im 30/54]
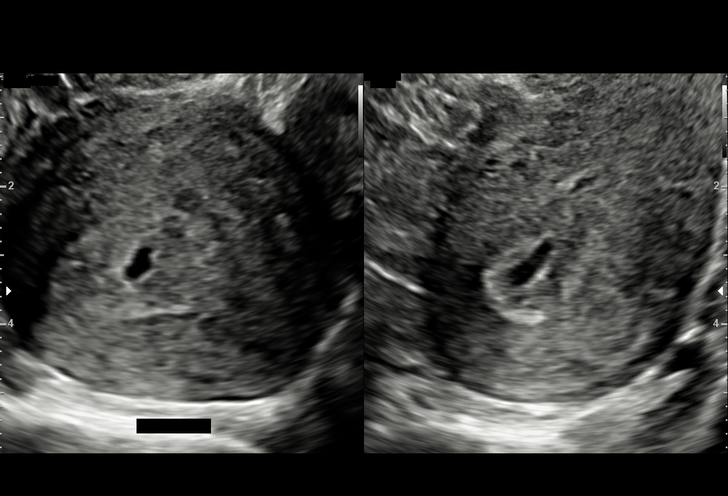
[im 34/54]
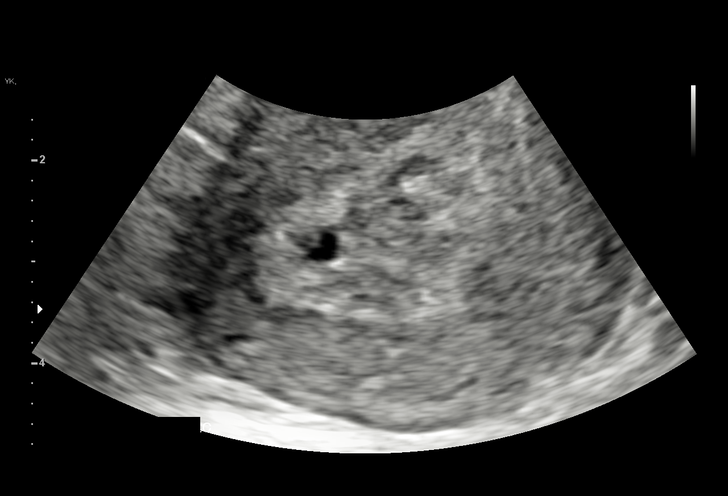
[im 38/54]
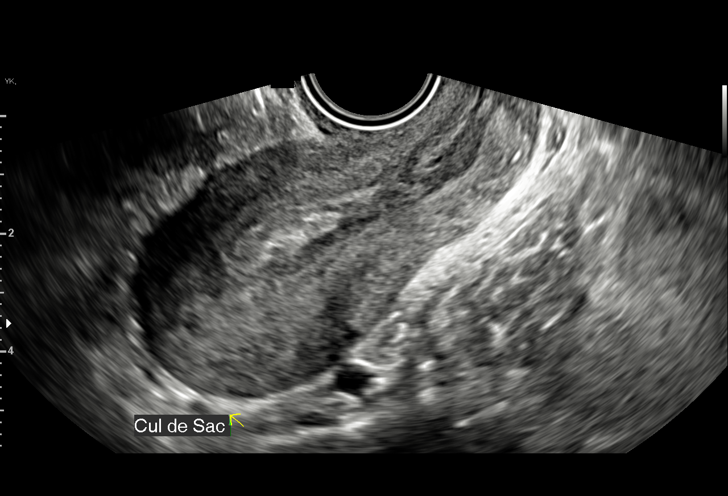
[im 42/54]
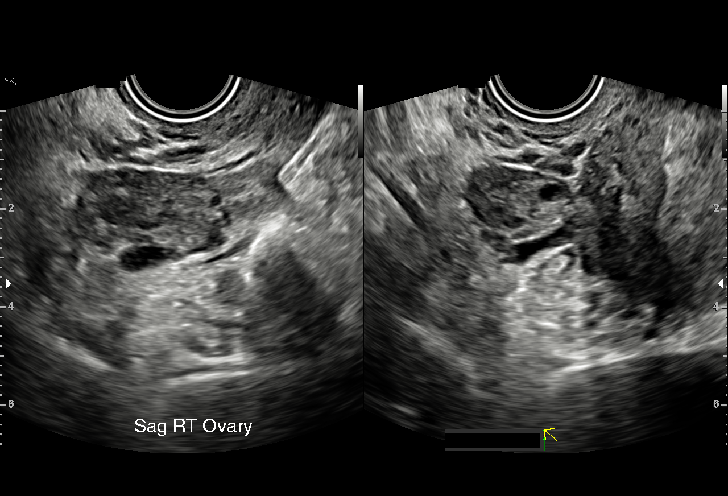
[im 46/54]
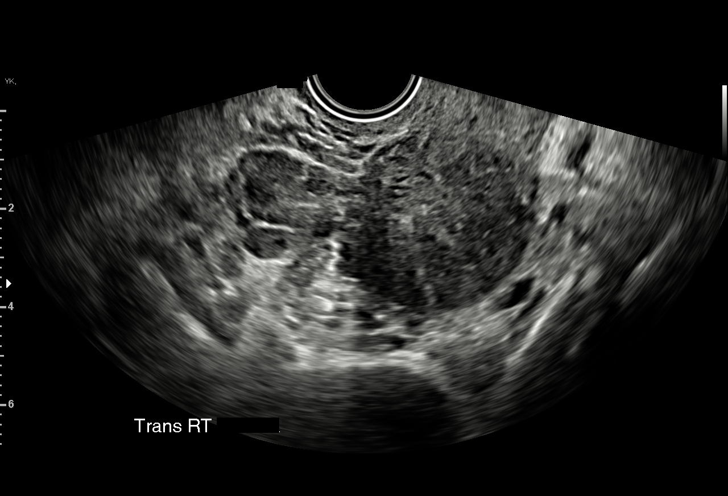
[im 50/54]
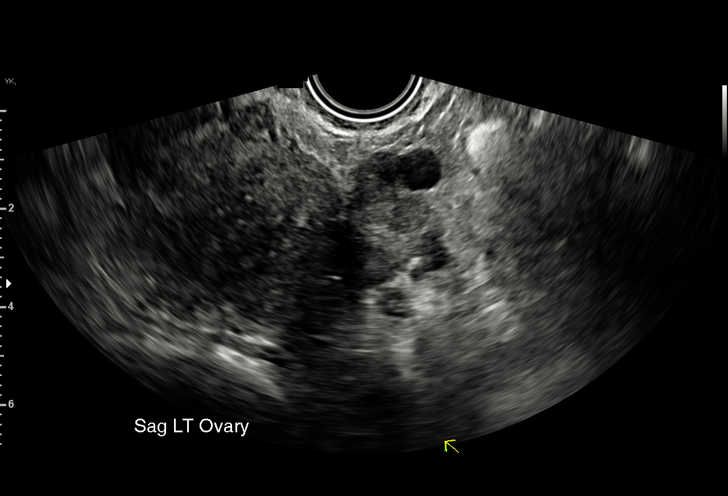
[im 54/54]
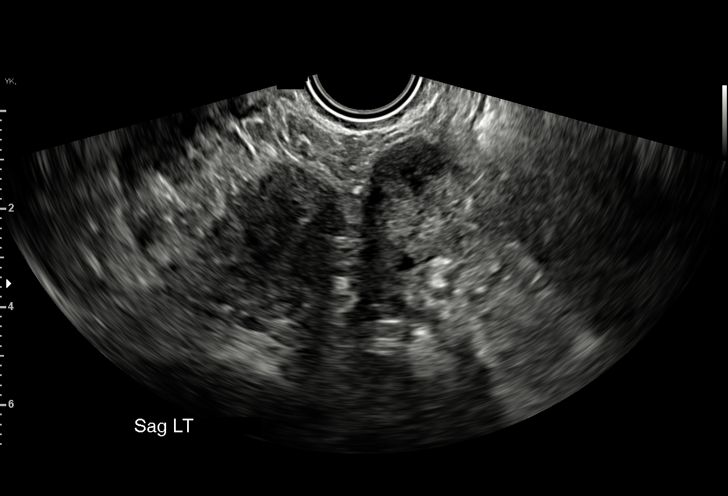

[15 of 28 positions shown; findings below may reference images not displayed]

FINDINGS: Intrauterine gestational sac: Single visualized.

Yolk sac:  Structure thought to be a yolk sac is seen on image 35.

Embryo:  Mild visualized.

Cardiac Activity: Not applicable.

MSD: 5.5 mm   5 w   2 d

Subchorionic hemorrhage:  None visualized.

Maternal uterus/adnexae: Normal.
IMPRESSION: Findings most compatible with early IUP.  No acute abnormality.

## 2022-03-03 ENCOUNTER — Other Ambulatory Visit: Payer: Self-pay | Admitting: Family Medicine

## 2022-03-03 DIAGNOSIS — F3342 Major depressive disorder, recurrent, in full remission: Secondary | ICD-10-CM

## 2022-03-03 DIAGNOSIS — F411 Generalized anxiety disorder: Secondary | ICD-10-CM

## 2022-03-11 ENCOUNTER — Other Ambulatory Visit: Payer: Self-pay | Admitting: *Deleted

## 2022-03-11 MED ORDER — PROMETHAZINE HCL 25 MG PO TABS: 25.0000 mg | ORAL_TABLET | Freq: Four times a day (QID) | ORAL | 2 refills | Status: DC | PRN

## 2022-03-12 ENCOUNTER — Other Ambulatory Visit: Payer: Self-pay

## 2022-03-12 ENCOUNTER — Ambulatory Visit (INDEPENDENT_AMBULATORY_CARE_PROVIDER_SITE_OTHER): Payer: Medicaid Other

## 2022-03-12 ENCOUNTER — Ambulatory Visit (INDEPENDENT_AMBULATORY_CARE_PROVIDER_SITE_OTHER): Payer: Medicaid Other | Admitting: *Deleted

## 2022-03-12 VITALS — BP 105/71 | HR 80 | Wt 143.0 lb

## 2022-03-12 DIAGNOSIS — O3680X0 Pregnancy with inconclusive fetal viability, not applicable or unspecified: Secondary | ICD-10-CM

## 2022-03-12 NOTE — Progress Notes (Signed)
DATING AND VIABILITY SONOGRAM ? ? ?Markala Sitts is a 36 y.o. year old [id number] with LMP Patient's last menstrual period was 01/26/2022. which would correlate to  30w3dweeks gestation. She is here today for a confirmatory initial sonogram. ? ?Patient informed that the ultrasound is considered a limited obstetric ultrasound and is not intended to be a complete ultrasound exam.  Patient also informed that the ultrasound is not being completed with the intent of assessing for fetal or placental anomalies or any pelvic abnormalities. Explained that the purpose of today's ultrasound is to assess for fetal heart rate.  Patient acknowledges the purpose of the exam and the limitations of the study.    ? ? ?Confirmed an IUP today, due to previous SAB's, will have pt come back to repeat scan in 2 weeks.  ? ? ?CCrosby Oyster?03/12/2022 ?2:06 PM ?  ?

## 2022-03-22 ENCOUNTER — Encounter (HOSPITAL_COMMUNITY): Payer: Self-pay | Admitting: Obstetrics & Gynecology

## 2022-03-22 ENCOUNTER — Inpatient Hospital Stay (HOSPITAL_COMMUNITY)
Admission: AD | Admit: 2022-03-22 | Discharge: 2022-03-22 | Disposition: A | Discharge status: Home / Self Care | Payer: Medicaid Other | Attending: Obstetrics & Gynecology | Admitting: Obstetrics & Gynecology

## 2022-03-22 DIAGNOSIS — R109 Unspecified abdominal pain: Secondary | ICD-10-CM | POA: Insufficient documentation

## 2022-03-22 DIAGNOSIS — Z3A01 Less than 8 weeks gestation of pregnancy: Secondary | ICD-10-CM | POA: Diagnosis not present

## 2022-03-22 DIAGNOSIS — Z87891 Personal history of nicotine dependence: Secondary | ICD-10-CM | POA: Insufficient documentation

## 2022-03-22 DIAGNOSIS — O98811 Other maternal infectious and parasitic diseases complicating pregnancy, first trimester: Secondary | ICD-10-CM | POA: Insufficient documentation

## 2022-03-22 DIAGNOSIS — B3731 Acute candidiasis of vulva and vagina: Secondary | ICD-10-CM | POA: Diagnosis not present

## 2022-03-22 LAB — URINALYSIS, ROUTINE W REFLEX MICROSCOPIC
Bilirubin Urine: NEGATIVE
Glucose, UA: NEGATIVE mg/dL
Ketones, ur: NEGATIVE mg/dL
Nitrite: NEGATIVE
Protein, ur: NEGATIVE mg/dL
Specific Gravity, Urine: 1.005 (ref 1.005–1.030)
pH: 8 (ref 5.0–8.0)

## 2022-03-22 LAB — WET PREP, GENITAL
Clue Cells Wet Prep HPF POC: NONE SEEN
Sperm: NONE SEEN
Trich, Wet Prep: NONE SEEN
WBC, Wet Prep HPF POC: >=10 — AB (ref <10)

## 2022-03-22 MED ORDER — TERCONAZOLE 0.4 % VA CREA: 1.0000 | TOPICAL_CREAM | Freq: Every day | VAGINAL | 0 refills | Status: DC

## 2022-03-22 NOTE — MAU Provider Note (Signed)
?History  ?  ? ?CSN: [id number] ? ?Arrival date and time: 03/22/22 1042 ? ? Event Date/Time  ? First Provider Initiated Contact with Patient 03/22/22 1113   ?  ? ?Chief Complaint  ?Patient presents with  ? Abdominal Pain  ? Vaginal Bleeding  ? ?36 year old G4 P1-0-2-1 at 7.6 weeks presenting with abdominal cramping and spotting.  Reports onset of cramping since last night.  States feels like menstrual cramps.  Rates pain 4 out of 10.  Has not treated.  Denies urinary symptoms.  Spotting started 2 days ago, was initially pink, and now brown, only when she wipes.  Reports also having thick yellow discharge, no malodor but some itching.  No recent intercourse.  Had ultrasound in office last week that showed viable IUP. ? ?OB History   ? ? Gravida  ?4  ? Para  ?1  ? Term  ?1  ? Preterm  ?   ? AB  ?2  ? Living  ?1  ?  ? ? SAB  ?2  ? IAB  ?   ? Ectopic  ?   ? Multiple  ?   ? Live Births  ?1  ?   ?  ?  ? ? ?Past Medical History:  ?Diagnosis Date  ? Anxiety   ? Depression   ? ? ?Past Surgical History:  ?Procedure Laterality Date  ? CESAREAN SECTION    ? WISDOM TOOTH EXTRACTION    ? ? ?Family History  ?Problem Relation Age of Onset  ? Cancer Maternal Aunt   ?     cervical  ? Heart disease Maternal Grandmother   ? Heart disease Paternal Grandfather   ? ? ?Social History  ? ?Tobacco Use  ? Smoking status: Former  ?  Types: Cigarettes  ?  Quit date: 09/21/2018  ?  Years since quitting: 3.5  ? Smokeless tobacco: Never  ?Vaping Use  ? Vaping Use: Never used  ?Substance Use Topics  ? Alcohol use: Not Currently  ? Drug use: Not Currently  ? ? ?Allergies: No Known Allergies ? ?Medications Prior to Admission  ?Medication Sig Dispense Refill Last Dose  ? DULoxetine (CYMBALTA) 20 MG capsule TAKE 1 CAPSULE BY MOUTH EVERY DAY 90 capsule 1   ? fexofenadine (ALLEGRA) 180 MG tablet Take by mouth.     ? fluticasone (FLONASE) 50 MCG/ACT nasal spray Place into the nose.     ? hydrOXYzine (ATARAX) 25 MG tablet Take 25 mg by mouth 3 (three) times  daily as needed.     ? progesterone (PROMETRIUM) 200 MG capsule Take 1 capsule (200 mg total) by mouth daily. 30 capsule 3   ? promethazine (PHENERGAN) 25 MG tablet Take 1 tablet (25 mg total) by mouth every 6 (six) hours as needed for nausea or vomiting. 30 tablet 2   ? sertraline (ZOLOFT) 50 MG tablet TAKE 1 TABLET BY MOUTH EVERY DAY 90 tablet 1   ? ? ?Review of Systems  ?Gastrointestinal:  Positive for abdominal pain and constipation. Negative for nausea and vomiting.  ?Genitourinary:  Positive for vaginal bleeding and vaginal discharge. Negative for dysuria, frequency and hematuria.  ?Physical Exam  ? ?Blood pressure 115/65, pulse 83, temperature 98.7 ?F (37.1 ?C), resp. rate 16, height '5\' 2"'$  (1.575 m), weight 64.9 kg, last menstrual period 01/26/2022, SpO2 97 %, unknown if currently breastfeeding. ? ?Physical Exam ?Vitals and nursing note reviewed.  ?Constitutional:   ?   Appearance: Normal appearance.  ?HENT:  ?   Head:  Normocephalic and atraumatic.  ?Cardiovascular:  ?   Rate and Rhythm: Normal rate.  ?Pulmonary:  ?   Effort: Pulmonary effort is normal. No respiratory distress.  ?Abdominal:  ?   General: There is no distension.  ?   Palpations: Abdomen is soft. There is no mass.  ?   Tenderness: There is no abdominal tenderness. There is no guarding or rebound.  ?   Hernia: No hernia is present.  ?Musculoskeletal:     ?   General: Normal range of motion.  ?   Cervical back: Normal range of motion.  ?Skin: ?   General: Skin is warm and dry.  ?Neurological:  ?   General: No focal deficit present.  ?   Mental Status: She is alert and oriented to person, place, and time.  ?Psychiatric:     ?   Mood and Affect: Mood normal.     ?   Behavior: Behavior normal.  ?Limited bedside US: viable, active fetus, FHR 176 bpm, subj. nml AFV, no [place] ? ?Results for orders placed or performed during the hospital encounter of 03/22/22 (from the past 24 hour(s))  ?Urinalysis, Routine w reflex microscopic Urine, Clean Catch      Status: Abnormal  ? Collection Time: 03/22/22 11:06 AM  ?Result Value Ref Range  ? Color, Urine YELLOW YELLOW  ? APPearance CLEAR CLEAR  ? Specific Gravity, Urine 1.005 1.005 - 1.030  ? pH 8.0 5.0 - 8.0  ? Glucose, UA NEGATIVE NEGATIVE mg/dL  ? Hgb urine dipstick MODERATE (A) NEGATIVE  ? Bilirubin Urine NEGATIVE NEGATIVE  ? Ketones, ur NEGATIVE NEGATIVE mg/dL  ? Protein, ur NEGATIVE NEGATIVE mg/dL  ? Nitrite NEGATIVE NEGATIVE  ? Leukocytes,Ua MODERATE (A) NEGATIVE  ? RBC / HPF 0-5 0 - 5 RBC/hpf  ? WBC, UA 0-5 0 - 5 WBC/hpf  ? Bacteria, UA RARE (A) NONE SEEN  ? Squamous Epithelial / LPF 0-5 0 - 5  ?Wet prep, genital     Status: Abnormal  ? Collection Time: 03/22/22 11:34 AM  ? Specimen: Vaginal  ?Result Value Ref Range  ? Yeast Wet Prep HPF POC PRESENT (A) NONE SEEN  ? Trich, Wet Prep NONE SEEN NONE SEEN  ? Clue Cells Wet Prep HPF POC NONE SEEN NONE SEEN  ? WBC, Wet Prep HPF POC >=10 (A) <10  ? Sperm NONE SEEN   ? ?MAU Course  ?Procedures ? ?MDM ?Labs ordered and reviewed.  No signs of threatened SAB.  Will treat yeast vaginitis.  UA with leuks and Hgb, patient without symptoms, will send urine culture.  Stable for discharge home. ? ?Assessment and Plan  ? ?1. [redacted] weeks gestation of pregnancy   ?2. Yeast vaginitis   ? ?Discharge home ?Follow-up at [facility] as scheduled ?SAB precautions ?Rx Terazol ? ? ?Allergies as of 03/22/2022   ?No Known Allergies ?  ? ?  ?Medication List  ?  ? ?TAKE these medications   ? ?DULoxetine 20 MG capsule ?Commonly known as: CYMBALTA ?TAKE 1 CAPSULE BY MOUTH EVERY DAY ?  ?fexofenadine 180 MG tablet ?Commonly known as: ALLEGRA ?Take by mouth. ?  ?fluticasone 50 MCG/ACT nasal spray ?Commonly known as: FLONASE ?Place into the nose. ?  ?hydrOXYzine 25 MG tablet ?Commonly known as: ATARAX ?Take 25 mg by mouth 3 (three) times daily as needed. ?  ?progesterone 200 MG capsule ?Commonly known as: PROMETRIUM ?Take 1 capsule (200 mg total) by mouth daily. ?  ?promethazine 25 MG tablet ?Commonly known as:  PHENERGAN ?Take 1 tablet (25 mg total) by mouth every 6 (six) hours as needed for nausea or vomiting. ?  ?sertraline 50 MG tablet ?Commonly known as: ZOLOFT ?TAKE 1 TABLET BY MOUTH EVERY DAY ?  ?terconazole 0.4 % vaginal cream ?Commonly known as: TERAZOL 7 ?Place 1 applicator vaginally at bedtime. ?  ? ?  ? ? ?Julianne Handler, CNM ?03/22/2022, 1:04 PM  ?

## 2022-03-22 NOTE — MAU Note (Signed)
Susan Suarez is a 36 y.o. at 10w6dhere in MAU reporting: Lower abdominal cramping that is constant, started last night. Having some spotting starting Thursday, off and on. Has had some lightheadedness when she stands.  ?LMP: 11/02/22 ?Onset of complaint: 03/21/22 ?Pain score: 4/10 ?Vitals:  ? 03/22/22 1103  ?BP: 115/65  ?Pulse: 83  ?Resp: 16  ?Temp: 98.7 ?F (37.1 ?C)  ?SpO2: 97%  ?   ?FHT: ?Lab orders placed from triage:  ? ?

## 2022-03-23 LAB — CULTURE, OB URINE

## 2022-03-24 LAB — GC/CHLAMYDIA PROBE AMP (~~LOC~~) NOT AT ARMC
Chlamydia: NEGATIVE
Comment: NEGATIVE
Comment: NORMAL
Neisseria Gonorrhea: NEGATIVE

## 2022-03-26 ENCOUNTER — Ambulatory Visit (INDEPENDENT_AMBULATORY_CARE_PROVIDER_SITE_OTHER): Payer: Medicaid Other | Admitting: *Deleted

## 2022-03-26 ENCOUNTER — Ambulatory Visit (INDEPENDENT_AMBULATORY_CARE_PROVIDER_SITE_OTHER): Payer: Medicaid Other

## 2022-03-26 VITALS — BP 111/72 | HR 81

## 2022-03-26 DIAGNOSIS — O3680X0 Pregnancy with inconclusive fetal viability, not applicable or unspecified: Secondary | ICD-10-CM | POA: Diagnosis not present

## 2022-03-26 DIAGNOSIS — R109 Unspecified abdominal pain: Secondary | ICD-10-CM

## 2022-03-26 DIAGNOSIS — Z3A08 8 weeks gestation of pregnancy: Secondary | ICD-10-CM

## 2022-03-26 DIAGNOSIS — O26899 Other specified pregnancy related conditions, unspecified trimester: Secondary | ICD-10-CM

## 2022-03-26 NOTE — Progress Notes (Cosign Needed)
Subjective:  ?Susan Suarez is a [id number] at 8wks days here for her repeat scan. Pt having some mild cramps and light pink discharge. Was treated for yeast infection and will resend OB urine culture as previous one recommended a recollection.  ? ? ?Objective:  ?BP 111/72   Pulse 81   LMP 01/26/2022   ? ?CRL today consistent with previous scan.  ? ?Assessment:   ?Viable IUP.  ? ?Plan:  ?Follow up in 3 weeks for New OB visit and will send treat if needed once urine culture results come back . ? ?Crosby Oyster, RN  ?

## 2022-03-28 LAB — URINE CULTURE, OB REFLEX: Organism ID, Bacteria: NO GROWTH

## 2022-03-28 LAB — CULTURE, OB URINE

## 2022-04-03 ENCOUNTER — Other Ambulatory Visit: Payer: Self-pay | Admitting: *Deleted

## 2022-04-03 MED ORDER — TERCONAZOLE 0.4 % VA CREA: 1.0000 | TOPICAL_CREAM | Freq: Every day | VAGINAL | 0 refills | Status: DC

## 2022-04-16 ENCOUNTER — Encounter: Payer: Self-pay | Admitting: Family Medicine

## 2022-04-16 ENCOUNTER — Other Ambulatory Visit (HOSPITAL_COMMUNITY)
Admission: RE | Admit: 2022-04-16 | Discharge: 2022-04-16 | Disposition: A | Discharge status: Home / Self Care | Payer: Medicaid Other | Source: Ambulatory Visit | Attending: Family Medicine | Admitting: Family Medicine

## 2022-04-16 ENCOUNTER — Ambulatory Visit (INDEPENDENT_AMBULATORY_CARE_PROVIDER_SITE_OTHER): Payer: Medicaid Other | Admitting: Family Medicine

## 2022-04-16 VITALS — BP 105/71 | HR 73 | Wt 144.0 lb

## 2022-04-16 DIAGNOSIS — O09521 Supervision of elderly multigravida, first trimester: Secondary | ICD-10-CM

## 2022-04-16 DIAGNOSIS — F411 Generalized anxiety disorder: Secondary | ICD-10-CM

## 2022-04-16 DIAGNOSIS — Z3490 Encounter for supervision of normal pregnancy, unspecified, unspecified trimester: Secondary | ICD-10-CM | POA: Insufficient documentation

## 2022-04-16 DIAGNOSIS — Z872 Personal history of diseases of the skin and subcutaneous tissue: Secondary | ICD-10-CM | POA: Insufficient documentation

## 2022-04-16 DIAGNOSIS — O09529 Supervision of elderly multigravida, unspecified trimester: Secondary | ICD-10-CM | POA: Insufficient documentation

## 2022-04-16 DIAGNOSIS — Z98891 History of uterine scar from previous surgery: Secondary | ICD-10-CM

## 2022-04-16 DIAGNOSIS — F3342 Major depressive disorder, recurrent, in full remission: Secondary | ICD-10-CM

## 2022-04-16 MED ORDER — HYDROXYZINE HCL 10 MG PO TABS: 10.0000 mg | ORAL_TABLET | Freq: Three times a day (TID) | ORAL | 5 refills | Status: DC | PRN

## 2022-04-16 NOTE — Patient Instructions (Addendum)
For cold and allergy symptoms ? ?You may take Mucinex, (Allegra, Claritin, Zyrtec, Xyzal, Clarinex-any one of these--they are the same class--same as Benadryl), Sudafed (must be behind the counter, should not be phenylephrine), saline or steroid (Nasacort, Nasonex and Flonase) nasal sprays. ? ?First Trimester of Pregnancy ? ?The first trimester of pregnancy starts on the first day of your last menstrual period until the end of week 12. This is months 1 through 3 of pregnancy. A week after a sperm fertilizes an egg, the egg will implant into the wall of the uterus and begin to develop into a baby. By the end of 12 weeks, all the baby's organs will be formed and the baby will be 2-3 inches in size. ?Body changes during your first trimester ?Your body goes through many changes during pregnancy. The changes vary and generally return to normal after your baby is born. ?Physical changes ?You may gain or lose weight. ?Your breasts may begin to grow larger and become tender. The tissue that surrounds your nipples (areola) may become darker. ?Dark spots or blotches (chloasma or mask of pregnancy) may develop on your face. ?You may have changes in your hair. These can include thickening or thinning of your hair or changes in texture. ?Health changes ?You may feel nauseous, and you may vomit. ?You may have heartburn. ?You may develop headaches. ?You may develop constipation. ?Your gums may bleed and may be sensitive to brushing and flossing. ?Other changes ?You may tire easily. ?You may urinate more often. ?Your menstrual periods will stop. ?You may have a loss of appetite. ?You may develop cravings for certain kinds of food. ?You may have changes in your emotions from day to day. ?You may have more vivid and strange dreams. ?Follow these instructions at home: ?Medicines ?Follow your health care provider's instructions regarding medicine use. Specific medicines may be either safe or unsafe to take during pregnancy. Do not take  any medicines unless told to by your health care provider. ?Take a prenatal vitamin that contains at least 600 micrograms (mcg) of folic acid. ?Eating and drinking ?Eat a healthy diet that includes fresh fruits and vegetables, whole grains, good sources of protein such as meat, eggs, or tofu, and low-fat dairy products. ?Avoid raw meat and unpasteurized juice, milk, and cheese. These carry germs that can harm you and your baby. ?If you feel nauseous or you vomit: ?Eat 4 or 5 small meals a day instead of 3 large meals. ?Try eating a few soda crackers. ?Drink liquids between meals instead of during meals. ?You may need to take these actions to prevent or treat constipation: ?Drink enough fluid to keep your urine pale yellow. ?Eat foods that are high in fiber, such as beans, whole grains, and fresh fruits and vegetables. ?Limit foods that are high in fat and processed sugars, such as fried or sweet foods. ?Activity ?Exercise only as directed by your health care provider. Most people can continue their usual exercise routine during pregnancy. Try to exercise for 30 minutes at least 5 days a week. ?Stop exercising if you develop pain or cramping in the lower abdomen or lower back. ?Avoid exercising if it is very hot or humid or if you are at high altitude. ?Avoid heavy lifting. ?If you choose to, you may have sex unless your health care provider tells you not to. ?Relieving pain and discomfort ?Wear a good support bra to relieve breast tenderness. ?Rest with your legs elevated if you have leg cramps or low back pain. ?  If you develop bulging veins (varicose veins) in your legs: ?Wear support hose as told by your health care provider. ?Elevate your feet for 15 minutes, 3-4 times a day. ?Limit salt in your diet. ?Safety ?Wear your seat belt at all times when driving or riding in a car. ?Talk with your health care provider if someone is verbally or physically abusive to you. ?Talk with your health care provider if you are  feeling sad or have thoughts of hurting yourself. ?Lifestyle ?Do not use hot tubs, steam rooms, or saunas. ?Do not douche. Do not use tampons or scented sanitary pads. ?Do not use herbal remedies, alcohol, illegal drugs, or medicines that are not approved by your health care provider. Chemicals in these products can harm your baby. ?Do not use any products that contain nicotine or tobacco, such as cigarettes, e-cigarettes, and chewing tobacco. If you need help quitting, ask your health care provider. ?Avoid cat litter boxes and soil used by cats. These carry germs that can cause birth defects in the baby and possibly loss of the unborn baby (fetus) by miscarriage or stillbirth. ?General instructions ?During routine prenatal visits in the first trimester, your health care provider will do a physical exam, perform necessary tests, and ask you how things are going. Keep all follow-up visits. This is important. ?Ask for help if you have counseling or nutritional needs during pregnancy. Your health care provider can offer advice or refer you to specialists for help with various needs. ?Schedule a dentist appointment. At home, brush your teeth with a soft toothbrush. Floss gently. ?Write down your questions. Take them to your prenatal visits. ?Where to find more information ?American Pregnancy Association: americanpregnancy.org ?[employer]: PoolDevices.com.pt ?Office on Women's Health: KeywordPortfolios.com.br ?Contact a health care provider if you have: ?Dizziness. ?A fever. ?Mild pelvic cramps, pelvic pressure, or nagging pain in the abdominal area. ?Nausea, vomiting, or diarrhea that lasts for 24 hours or longer. ?A bad-smelling vaginal discharge. ?Pain when you urinate. ?Known exposure to a contagious illness, such as chickenpox, measles, Zika virus, HIV, or hepatitis. ?Get help right away if you have: ?Spotting or bleeding from your vagina. ?Severe  abdominal cramping or pain. ?Shortness of breath or chest pain. ?Any kind of trauma, such as from a fall or a car crash. ?New or increased pain, swelling, or redness in an arm or leg. ?Summary ?The first trimester of pregnancy starts on the first day of your last menstrual period until the end of week 12 (months 1 through 3). ?Eating 4 or 5 small meals a day rather than 3 large meals may help to relieve nausea and vomiting. ?Do not use any products that contain nicotine or tobacco, such as cigarettes, e-cigarettes, and chewing tobacco. If you need help quitting, ask your health care provider. ?Keep all follow-up visits. This is important. ?This information is not intended to replace advice given to you by your health care provider. Make sure you discuss any questions you have with your health care provider. ?Document Revised: 05/16/2020 Document Reviewed: 03/22/2020 ?Elsevier Patient Education ? [place]. ? ?

## 2022-04-16 NOTE — Progress Notes (Signed)
NOB 11wks 3d ?GAD 7 =12  ?PHQ9=  9 ? ?Scan done 03/26/22 ? ?Last pap: [facility] in [place] 07/2019. ? ?Genetic Screening: Desires /Wants to know gender.  ? ?CC: pt has concerns with past miscarriages.  ? ?

## 2022-04-16 NOTE — Progress Notes (Signed)
?  ? ?Subjective:  ? ?Susan Suarez is a 36 y.o. [id number] at 4w3dby LMP, early ultrasound being seen today for her first obstetrical visit.  Her obstetrical history is significant for advanced maternal age and prior classical c-section . Patient does intend to breast feed. Pregnancy history fully reviewed. ? ?Patient reports fatigue. ? ?HISTORY: ?OB History  ?Gravida Para Term Preterm AB Living  ?'4 1 1 '$ 0 2 1  ?SAB IAB Ectopic Multiple Live Births  ?2 0 0 0 1  ?  ?# Outcome Date GA Lbr Len/2nd Weight Sex Delivery Anes PTL Lv  ?4 Current           ?3 SAB 09/2021          ?2 Term 07/05/19   9 lb 2.7 oz (4.159 kg) F CS-Classical   LIV  ?1 SAB           ? Last pap smear was  2020 and was normal ?Past Medical History:  ?Diagnosis Date  ? Anxiety   ? Depression   ? ?Past Surgical History:  ?Procedure Laterality Date  ? CESAREAN SECTION    ? WISDOM TOOTH EXTRACTION    ? ?Family History  ?Problem Relation Age of Onset  ? Cancer Maternal Aunt   ?     cervical  ? Heart disease Maternal Grandmother   ? Heart disease Paternal Grandfather   ? ?Social History  ? ?Tobacco Use  ? Smoking status: Former  ?  Types: Cigarettes  ?  Quit date: 09/21/2018  ?  Years since quitting: 3.5  ? Smokeless tobacco: Never  ?Vaping Use  ? Vaping Use: Never used  ?Substance Use Topics  ? Alcohol use: Not Currently  ? Drug use: Not Currently  ? ?No Known Allergies ?Current Outpatient Medications on File Prior to Visit  ?Medication Sig Dispense Refill  ? fexofenadine (ALLEGRA) 180 MG tablet Take by mouth.    ? fluticasone (FLONASE) 50 MCG/ACT nasal spray Place into the nose.    ? progesterone (PROMETRIUM) 200 MG capsule Take 1 capsule (200 mg total) by mouth daily. 30 capsule 3  ? promethazine (PHENERGAN) 25 MG tablet Take 1 tablet (25 mg total) by mouth every 6 (six) hours as needed for nausea or vomiting. 30 tablet 2  ? sertraline (ZOLOFT) 50 MG tablet TAKE 1 TABLET BY MOUTH EVERY DAY 90 tablet 1  ? terconazole (TERAZOL 7) 0.4 % vaginal cream  Place 1 applicator vaginally at bedtime. 45 g 0  ? Triamcinolone Acetonide (NASACORT ALLERGY 24HR NA)     ? ?No current facility-administered medications on file prior to visit.  ? ? ? ?Exam  ? ?Vitals:  ? 04/16/22 1026  ?BP: 105/71  ?Pulse: 73  ?Weight: 144 lb (65.3 kg)  ? ?Fetal Heart Rate (bpm): 156 ? ?Uterus:   12 weeks size  ?Pelvic Exam: Perineum: no hemorrhoids, normal perineum  ? Vulva: normal external genitalia, no lesions  ? Vagina:  normal mucosa, normal discharge  ? Cervix: no lesions and normal, pap smear done.   ? Adnexa: normal adnexa and no mass, fullness, tenderness  ? Bony Pelvis: average  ?System: General: well-developed, well-nourished female in no acute distress  ? Breast:  normal appearance, no masses or tenderness  ? Skin: normal coloration and turgor, no rashes  ? Neurologic: oriented, normal, negative, normal mood  ? Extremities: normal strength, tone, and muscle mass, ROM of all joints is normal  ? HEENT PERRLA, extraocular movement intact and sclera clear, anicteric  ?  Mouth/Teeth mucous membranes moist, pharynx normal without lesions and dental hygiene good  ? Neck supple and no masses  ? Cardiovascular: regular rate and rhythm  ? Respiratory:  no respiratory distress, normal breath sounds  ? Abdomen: soft, non-tender; bowel sounds normal; no masses,  no organomegaly  ? ?  ?Assessment:  ? ?Pregnancy: [id number] ?Patient Active Problem List  ? Diagnosis Date Noted  ? Supervision of normal pregnancy, antepartum 04/16/2022  ? AMA (advanced maternal age) multigravida 35+ 04/16/2022  ? History of actinic keratosis 04/16/2022  ? Previous cesarean section 04/16/2022  ? 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 Rosacea 05/14/2021  ? Recurrent major depressive disorder, in full remission ([place]) 10/28/2017  ? Cervical neuropathy 04/07/2016  ? Generalized anxiety disorder 04/07/2016  ? ?  ?Plan:  ?1. Encounter for supervision of normal pregnancy,  antepartum, unspecified gravidity ?New OB labs and genetics ?Has FH of DMD--will expand carrier screening ?- Enroll Patient in PreNatal Babyscripts ?- CBC/D/Plt+RPR+Rh+ABO+RubIgG... ?- Culture, OB Urine ?- Genetic Screening ?- Cytology - PAP ? ?2. Multigravida of advanced maternal age in first trimester ?NIPT ? ?3. Generalized anxiety disorder ?Continue zoloft--it is low dose--to let me know if wants to increase ?- hydrOXYzine (ATARAX) 10 MG tablet; Take 1 tablet (10 mg total) by mouth 3 (three) times daily as needed.  Dispense: 30 tablet; Refill: 5 ? ?4. Recurrent major depressive disorder, in full remission ([place]) ?Continue Zoloft ? ?5. Previous cesarean section ?Classical will need repeat at 37 weeks ? ? ?Initial labs drawn. ?Continue prenatal vitamins. ?Genetic Screening discussed, NIPS: ordered. ?Ultrasound discussed; fetal anatomic survey: ordered. ?Problem list reviewed and updated. ?The nature of [facility] with multiple MDs and other Advanced Practice Providers was explained to patient; also emphasized that residents, students are part of our team. ?Routine obstetric precautions reviewed. ?Return in 4 weeks (on 05/14/2022) for please book at [facility] if needed. ? ?  ? ? ?

## 2022-04-17 LAB — CBC/D/PLT+RPR+RH+ABO+RUBIGG...
Antibody Screen: NEGATIVE
Basophils Absolute: 0 10*3/uL (ref 0.0–0.2)
Basos: 0 %
EOS (ABSOLUTE): 0 10*3/uL (ref 0.0–0.4)
Eos: 1 %
HCV Ab: NONREACTIVE
HIV Screen 4th Generation wRfx: NONREACTIVE
Hematocrit: 41.3 % (ref 34.0–46.6)
Hemoglobin: 13.8 g/dL (ref 11.1–15.9)
Hepatitis B Surface Ag: NEGATIVE
Immature Grans (Abs): 0 10*3/uL (ref 0.0–0.1)
Immature Granulocytes: 0 %
Lymphocytes Absolute: 2.5 10*3/uL (ref 0.7–3.1)
Lymphs: 30 %
MCH: 30.7 pg (ref 26.6–33.0)
MCHC: 33.4 g/dL (ref 31.5–35.7)
MCV: 92 fL (ref 79–97)
Monocytes Absolute: 0.5 10*3/uL (ref 0.1–0.9)
Monocytes: 6 %
Neutrophils Absolute: 5.1 10*3/uL (ref 1.4–7.0)
Neutrophils: 63 %
Platelets: 269 10*3/uL (ref 150–450)
RBC: 4.5 x10E6/uL (ref 3.77–5.28)
RDW: 13.4 % (ref 11.7–15.4)
RPR Ser Ql: NONREACTIVE
Rh Factor: POSITIVE
Rubella Antibodies, IGG: 4.96 index (ref >0.99)
WBC: 8.1 10*3/uL (ref 3.4–10.8)

## 2022-04-17 LAB — HCV INTERPRETATION

## 2022-04-18 LAB — URINE CULTURE, OB REFLEX

## 2022-04-18 LAB — CULTURE, OB URINE

## 2022-04-22 ENCOUNTER — Telehealth: Payer: Self-pay | Admitting: *Deleted

## 2022-04-22 ENCOUNTER — Encounter: Payer: Self-pay | Admitting: *Deleted

## 2022-04-22 LAB — CYTOLOGY - PAP
Chlamydia: NEGATIVE
Comment: NEGATIVE
Comment: NEGATIVE
Comment: NORMAL
Diagnosis: NEGATIVE
High risk HPV: NEGATIVE
Neisseria Gonorrhea: NEGATIVE

## 2022-04-22 NOTE — Telephone Encounter (Signed)
Pt informed of panorama results  ?

## 2022-04-22 NOTE — Telephone Encounter (Signed)
Left message for pt to call back for panorama results  ?

## 2022-04-28 ENCOUNTER — Encounter: Payer: Self-pay | Admitting: *Deleted

## 2022-05-22 ENCOUNTER — Ambulatory Visit (INDEPENDENT_AMBULATORY_CARE_PROVIDER_SITE_OTHER): Payer: Medicaid Other | Admitting: Family Medicine

## 2022-05-22 VITALS — BP 121/80 | HR 76 | Wt 147.5 lb

## 2022-05-22 DIAGNOSIS — Z98891 History of uterine scar from previous surgery: Secondary | ICD-10-CM

## 2022-05-22 DIAGNOSIS — Z3490 Encounter for supervision of normal pregnancy, unspecified, unspecified trimester: Secondary | ICD-10-CM

## 2022-05-22 DIAGNOSIS — F411 Generalized anxiety disorder: Secondary | ICD-10-CM

## 2022-05-22 DIAGNOSIS — O09522 Supervision of elderly multigravida, second trimester: Secondary | ICD-10-CM

## 2022-05-22 DIAGNOSIS — F3342 Major depressive disorder, recurrent, in full remission: Secondary | ICD-10-CM

## 2022-05-22 MED ORDER — SERTRALINE HCL 100 MG PO TABS: 100.0000 mg | ORAL_TABLET | Freq: Every day | ORAL | 3 refills | Status: DC

## 2022-05-22 NOTE — Patient Instructions (Signed)

## 2022-05-22 NOTE — Progress Notes (Signed)
   PRENATAL VISIT NOTE  Subjective:  Susan Suarez is a 36 y.o. G4P1021 at 35w4dbeing seen today for ongoing prenatal care.  She is currently monitored for the following issues for this low-risk pregnancy and has 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; Cervical neuropathy; Generalized anxiety disorder; Recurrent major depressive disorder, in full remission ([place]; Rosacea; History of recurrent miscarriages; Supervision of normal pregnancy, antepartum; AMA (advanced maternal age) multigravida 373+ History of actinic keratosis; and Previous cesarean section on their problem list.  Patient reports  itching and not feeling like self since stopping Cymbalta .  Contractions: Not present. Vag. Bleeding: None.  Movement: Absent. Denies leaking of fluid.   The following portions of the patient's history were reviewed and updated as appropriate: allergies, current medications, past family history, past medical history, past social history, past surgical history and problem list.   Objective:   Vitals:   05/22/22 1418  BP: 121/80  Pulse: 76  Weight: 147 lb 8 oz (66.9 kg)    Fetal Status: Fetal Heart Rate (bpm): 142   Movement: Absent     General:  Alert, oriented and cooperative. Patient is in no acute distress.  Skin: Skin is warm and dry. No rash noted.   Cardiovascular: Normal heart rate noted  Respiratory: Normal respiratory effort, no problems with respiration noted  Abdomen: Soft, gravid, appropriate for gestational age.  Pain/Pressure: Absent     Pelvic: Cervical exam deferred        Extremities: Normal range of motion.  Edema: None  Mental Status: Normal mood and affect. Normal behavior. Normal judgment and thought content.   Assessment and Plan:  Pregnancy: G4P1021 at 116w4d. Previous cesarean section Ro RCS  2. Encounter for supervision of normal pregnancy, antepartum, unspecified gravidity AFP today Schedule anatomy u/s Due to itching stop prometrium - AFP, Serum, Open  Spina Bifida - [place] OB DETAIL +14 WK; Future  3. Multigravida of advanced maternal age in second trimester LR NIPT  4. Generalized anxiety disorder Increase to 100 mg daily - sertraline (ZOLOFT) 100 MG tablet; Take 1 tablet (100 mg total) by mouth daily.  Dispense: 90 tablet; Refill: 3  5. Recurrent major depressive disorder, in full remission ([place]CF[place]Increase to 100 mg daily--usual onset of action - sertraline (ZOLOFT) 100 MG tablet; Take 1 tablet (100 mg total) by mouth daily.  Dispense: 90 tablet; Refill: 3  Preterm labor symptoms and general obstetric precautions including but not limited to vaginal bleeding, contractions, leaking of fluid and fetal movement were reviewed in detail with the patient. Please refer to After Visit Summary for other counseling recommendations.   Return in 4 weeks (on 06/19/2022).  Future Appointments  Date Time Provider [place]6/27/2023  9:30 AM [facility]6/27/2023  9:45 AM [facility] US4 [facility]6/27/2023  2:30 PM Anyanwu, UgSallyanne HaversMD [facility]    TaDonnamae JudeMD

## 2022-05-23 LAB — AFP, SERUM, OPEN SPINA BIFIDA
AFP MoM: 1.13
AFP Value: 41.5 ng/mL
Gest. Age on Collection Date: 16.4 weeks
Maternal Age At EDD: 37.2 yr
OSBR Risk 1 IN: 8106
Test Results:: NEGATIVE
Weight: 148 [lb_av]

## 2022-06-17 ENCOUNTER — Ambulatory Visit (INDEPENDENT_AMBULATORY_CARE_PROVIDER_SITE_OTHER): Payer: Medicaid Other | Admitting: Obstetrics & Gynecology

## 2022-06-17 ENCOUNTER — Ambulatory Visit: Payer: Medicaid Other | Admitting: *Deleted

## 2022-06-17 ENCOUNTER — Encounter: Payer: Self-pay | Admitting: *Deleted

## 2022-06-17 ENCOUNTER — Encounter: Payer: Self-pay | Admitting: Obstetrics & Gynecology

## 2022-06-17 ENCOUNTER — Ambulatory Visit: Payer: Medicaid Other | Attending: Family Medicine

## 2022-06-17 ENCOUNTER — Other Ambulatory Visit: Payer: Self-pay | Admitting: *Deleted

## 2022-06-17 VITALS — BP 116/64 | HR 86

## 2022-06-17 VITALS — BP 96/64 | HR 78 | Wt 149.4 lb

## 2022-06-17 DIAGNOSIS — Z98891 History of uterine scar from previous surgery: Secondary | ICD-10-CM | POA: Insufficient documentation

## 2022-06-17 DIAGNOSIS — O09522 Supervision of elderly multigravida, second trimester: Secondary | ICD-10-CM | POA: Diagnosis present

## 2022-06-17 DIAGNOSIS — O321XX0 Maternal care for breech presentation, not applicable or unspecified: Secondary | ICD-10-CM | POA: Insufficient documentation

## 2022-06-17 DIAGNOSIS — Z3A20 20 weeks gestation of pregnancy: Secondary | ICD-10-CM | POA: Diagnosis not present

## 2022-06-17 DIAGNOSIS — Z363 Encounter for antenatal screening for malformations: Secondary | ICD-10-CM | POA: Diagnosis not present

## 2022-06-17 DIAGNOSIS — Z3490 Encounter for supervision of normal pregnancy, unspecified, unspecified trimester: Secondary | ICD-10-CM

## 2022-06-17 DIAGNOSIS — Z3480 Encounter for supervision of other normal pregnancy, unspecified trimester: Secondary | ICD-10-CM

## 2022-06-17 DIAGNOSIS — O34219 Maternal care for unspecified type scar from previous cesarean delivery: Secondary | ICD-10-CM | POA: Diagnosis not present

## 2022-06-17 DIAGNOSIS — Z362 Encounter for other antenatal screening follow-up: Secondary | ICD-10-CM

## 2022-07-16 ENCOUNTER — Ambulatory Visit (INDEPENDENT_AMBULATORY_CARE_PROVIDER_SITE_OTHER): Payer: Medicaid Other | Admitting: Family Medicine

## 2022-07-16 VITALS — BP 97/64 | HR 80 | Wt 153.0 lb

## 2022-07-16 DIAGNOSIS — Z3480 Encounter for supervision of other normal pregnancy, unspecified trimester: Secondary | ICD-10-CM

## 2022-07-16 DIAGNOSIS — M5432 Sciatica, left side: Secondary | ICD-10-CM

## 2022-07-16 DIAGNOSIS — M5431 Sciatica, right side: Secondary | ICD-10-CM

## 2022-07-16 DIAGNOSIS — L282 Other prurigo: Secondary | ICD-10-CM

## 2022-07-16 DIAGNOSIS — O09522 Supervision of elderly multigravida, second trimester: Secondary | ICD-10-CM

## 2022-07-16 DIAGNOSIS — Z98891 History of uterine scar from previous surgery: Secondary | ICD-10-CM

## 2022-07-16 DIAGNOSIS — O99712 Diseases of the skin and subcutaneous tissue complicating pregnancy, second trimester: Secondary | ICD-10-CM

## 2022-07-16 NOTE — Progress Notes (Signed)
Having itchy palms and feet

## 2022-07-16 NOTE — Progress Notes (Signed)
   PRENATAL VISIT NOTE  Subjective:  Susan Suarez is a 36 y.o. G4P1021 at 73w3dbeing seen today for ongoing prenatal care.  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  Patient reports  itching, worse at night and on palms and soles. Sister with cholestasis .  Contractions: Not present. Vag. Bleeding: None.  Movement: Present. Denies leaking of fluid.   The following portions of the patient's history were reviewed and updated as appropriate: allergies, current medications, past family history, past medical history, past social history, past surgical history and problem list.   Objective:   Vitals:   07/16/22 1013  BP: 97/64  Pulse: 80  Weight: 153 lb (69.4 kg)    Fetal Status: Fetal Heart Rate (bpm): 154   Movement: Present     General:  Alert, oriented and cooperative. Patient is in no acute distress.  Skin: Skin is warm and dry. No rash noted.   Cardiovascular: Normal heart rate noted  Respiratory: Normal respiratory effort, no problems with respiration noted  Abdomen: Soft, gravid, appropriate for gestational age.  Pain/Pressure: Absent     Pelvic: Cervical exam deferred        Extremities: Normal range of motion.  Edema: Trace  Mental Status: Normal mood and affect. Normal behavior. Normal judgment and thought content.   Assessment and Plan:  Pregnancy: G4P1021 at 250w3d. Supervision of other normal pregnancy, antepartum Continue prenatal care.  2. Multigravida of advanced maternal age in second trimester LR NIPT  3. Previous Classical Cesarean  Section For RCS at 37 wks  4. Bilateral sciatica - Ambulatory referral to Physical Therapy  5. Prurigo of pregnancy in second trimester Check bile acids - Bile acids, total  Preterm labor symptoms and general obstetric precautions including but not limited to vaginal bleeding, contractions, leaking of fluid and fetal movement were reviewed in detail with the patient. Please refer to After Visit Summary for other counseling recommendations.   Return in 4 weeks (on 08/13/2022) for 28 wk labs.  Future Appointments  Date Time Provider [place]earrington Village8/23/2023  8:15 AM [facility] LAB [facility]  08/13/2022  8:35 AM PrDonnamae JudeMD [facility]  08/27/2022  2:30 PM PrDonnamae JudeMD [facility]  09/08/2022  7:45 AM [facility] NURSE [facility]9/18/2023  8:00 AM [facility] US1 [facility]/20/2023  1:30 PM PiAletha HalimMD [facility]  09/24/2022  1:30 PM PrDonnamae JudeMD [facility]  10/08/2022  1:30 PM PrDonnamae JudeMD [facility]  10/15/2022  1:30 PM PrDonnamae JudeMD [facility]    TaDonnamae JudeMD

## 2022-07-18 LAB — BILE ACIDS, TOTAL: Bile Acids Total: 3.9 umol/L (ref 0.0–10.0)

## 2022-08-13 ENCOUNTER — Ambulatory Visit (INDEPENDENT_AMBULATORY_CARE_PROVIDER_SITE_OTHER): Payer: Medicaid Other | Admitting: Family Medicine

## 2022-08-13 ENCOUNTER — Other Ambulatory Visit: Payer: Medicaid Other

## 2022-08-13 VITALS — BP 106/68 | HR 91 | Wt 160.0 lb

## 2022-08-13 DIAGNOSIS — Z98891 History of uterine scar from previous surgery: Secondary | ICD-10-CM

## 2022-08-13 DIAGNOSIS — Z23 Encounter for immunization: Secondary | ICD-10-CM | POA: Diagnosis not present

## 2022-08-13 DIAGNOSIS — Z3480 Encounter for supervision of other normal pregnancy, unspecified trimester: Secondary | ICD-10-CM

## 2022-08-13 DIAGNOSIS — Z3A28 28 weeks gestation of pregnancy: Secondary | ICD-10-CM | POA: Diagnosis not present

## 2022-08-13 DIAGNOSIS — O09523 Supervision of elderly multigravida, third trimester: Secondary | ICD-10-CM | POA: Diagnosis not present

## 2022-08-13 NOTE — Progress Notes (Signed)
   PRENATAL VISIT NOTE  Subjective:  Susan Suarez is a 36 y.o. G4P1021 at 73w3dbeing seen today for ongoing prenatal care.  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  Patient reports no complaints.  Contractions: Not present. Vag. Bleeding: None.  Movement: Present. Denies leaking of fluid.   The following portions of the patient's history were reviewed and updated as appropriate: allergies, current medications, past family history, past medical history, past social history, past surgical history and problem list.   Objective:   Vitals:   08/13/22 0828  BP: 106/68  Pulse: 91  Weight: 160 lb (72.6 kg)    Fetal Status: Fetal Heart Rate (bpm): 150 Fundal Height: 28 cm Movement: Present     General:  Alert, oriented and cooperative. Patient is in no acute distress.  Skin: Skin is warm and dry. No rash noted.   Cardiovascular: Normal heart rate noted  Respiratory: Normal respiratory effort, no problems with respiration noted  Abdomen: Soft, gravid, appropriate for gestational age.  Pain/Pressure: Absent     Pelvic: Cervical exam deferred        Extremities: Normal range of motion.  Edema: Trace  Mental Status: Normal mood and affect. Normal behavior. Normal judgment and thought content.   Assessment and Plan:  Pregnancy: G4P1021 at 265w3d. Supervision of other normal pregnancy, antepartum Continue prenatal care.  2. Previous Classical Cesarean Section For RCS at 37 weeks, booked today  3. Multigravida of advanced maternal age in third trimester LR NIPT Has  growth in mid-September  Preterm labor symptoms and general obstetric precautions including but not limited to vaginal bleeding, contractions, leaking of fluid and fetal movement were reviewed in detail with the patient. Please refer to After Visit Summary for other counseling recommendations.   Return in 2 weeks (on 08/27/2022).  Future Appointments  Date Time Provider De[place]9/05/2022  2:30 PM PrDonnamae JudeMD [facility]  09/08/2022  7:45 AM [facility] NURSE [facility]/20[facility]  8:00 AM [facility] US1 [facility]  1:30 PM PiAletha HalimMD [facility]  09/24/2022  1:30 PM PrDonnamae JudeMD [facility]  10/08/2022  1:30 PM PrDonnamae JudeMD [facility]  10/15/2022  1:30 PM PrDonnamae JudeMD [facility]  10/22/2022  1:30 PM PrDonnamae JudeMD [facility]  11/05/2022  1:30 PM PrDonnamae JudeMD [facility]    TaDonnamae JudeMD

## 2022-08-14 ENCOUNTER — Other Ambulatory Visit: Payer: Self-pay | Admitting: *Deleted

## 2022-08-14 ENCOUNTER — Telehealth: Payer: Self-pay | Admitting: *Deleted

## 2022-08-14 ENCOUNTER — Encounter: Payer: Self-pay | Admitting: Family Medicine

## 2022-08-14 DIAGNOSIS — O24419 Gestational diabetes mellitus in pregnancy, unspecified control: Secondary | ICD-10-CM

## 2022-08-14 DIAGNOSIS — O24410 Gestational diabetes mellitus in pregnancy, diet controlled: Secondary | ICD-10-CM

## 2022-08-14 HISTORY — DX: Gestational diabetes mellitus in pregnancy, unspecified control: O24.419

## 2022-08-14 LAB — GLUCOSE TOLERANCE, 2 HOURS W/ 1HR
Glucose, 1 hour: 160 mg/dL (ref 70–179)
Glucose, 2 hour: 138 mg/dL (ref 70–152)
Glucose, Fasting: 98 mg/dL — ABNORMAL HIGH (ref 70–91)

## 2022-08-14 LAB — CBC
Hematocrit: 33.8 % — ABNORMAL LOW (ref 34.0–46.6)
Hemoglobin: 11.6 g/dL (ref 11.1–15.9)
MCH: 32 pg (ref 26.6–33.0)
MCHC: 34.3 g/dL (ref 31.5–35.7)
MCV: 93 fL (ref 79–97)
Platelets: 204 10*3/uL (ref 150–450)
RBC: 3.62 x10E6/uL — ABNORMAL LOW (ref 3.77–5.28)
RDW: 12.6 % (ref 11.7–15.4)
WBC: 8.1 10*3/uL (ref 3.4–10.8)

## 2022-08-14 LAB — HIV ANTIBODY (ROUTINE TESTING W REFLEX): HIV Screen 4th Generation wRfx: NONREACTIVE

## 2022-08-14 LAB — RPR: RPR Ser Ql: NONREACTIVE

## 2022-08-14 MED ORDER — ACCU-CHEK GUIDE W/DEVICE KIT: 1.0000 | PACK | Freq: Four times a day (QID) | 0 refills | Status: DC

## 2022-08-14 MED ORDER — ACCU-CHEK GUIDE VI STRP: ORAL_STRIP | 12 refills | Status: DC

## 2022-08-14 MED ORDER — ACCU-CHEK SOFTCLIX LANCETS MISC: 1.0000 | Freq: Four times a day (QID) | 12 refills | Status: DC

## 2022-08-14 NOTE — Telephone Encounter (Signed)
Called pt to discuss new GDM diagnosis. Informed that we will send in supplies to pharmacy to be able to check blood sugars. We will also send a referral in to Diabetes and Nutrition to better learn how to manage GDM. Discussed with patient that once they take the class they can then start checking blood sugars four times a day: fasting, and 2hr after each meal. Informed of the normal ranges we would like to see the blood sugars and to make sure they are writing them down or keeping a record of them to bring into each appointment so they can go over them with provider. Pt verbalizes and understands, to call or MyChart message us if any questions.   

## 2022-08-14 NOTE — Telephone Encounter (Signed)
-----   Message from Donnamae Jude, MD sent at 08/14/2022  8:16 AM EDT ----- Has GDM--please schedule with [facility] mgmt

## 2022-08-18 ENCOUNTER — Encounter: Payer: Self-pay | Admitting: Family Medicine

## 2022-08-19 ENCOUNTER — Ambulatory Visit: Payer: Medicaid Other | Attending: Family Medicine

## 2022-08-19 ENCOUNTER — Other Ambulatory Visit: Payer: Self-pay

## 2022-08-19 DIAGNOSIS — M5459 Other low back pain: Secondary | ICD-10-CM | POA: Diagnosis present

## 2022-08-19 DIAGNOSIS — R252 Cramp and spasm: Secondary | ICD-10-CM | POA: Diagnosis present

## 2022-08-19 DIAGNOSIS — R262 Difficulty in walking, not elsewhere classified: Secondary | ICD-10-CM | POA: Diagnosis present

## 2022-08-19 DIAGNOSIS — M5431 Sciatica, right side: Secondary | ICD-10-CM | POA: Insufficient documentation

## 2022-08-19 DIAGNOSIS — M5432 Sciatica, left side: Secondary | ICD-10-CM | POA: Insufficient documentation

## 2022-08-19 DIAGNOSIS — M6281 Muscle weakness (generalized): Secondary | ICD-10-CM | POA: Insufficient documentation

## 2022-08-19 NOTE — Therapy (Signed)
OUTPATIENT PHYSICAL THERAPY THORACOLUMBAR EVALUATION   Patient Name: Susan Suarez MRN: [id number] DOB:[birth year], 36 y.o., female Today's Date: 08/19/2022   PT End of Session - 08/19/22 1720     Visit Number 1    Date for PT Re-Evaluation 10/14/22    Authorization Type [place] Medicaid [employer]    PT Start Time 0932    PT Stop Time 1530    PT Time Calculation (min) 45 min    Activity Tolerance Patient tolerated treatment well    Behavior During Therapy [facility] for tasks assessed/performed             Past Medical History:  Diagnosis Date   Anxiety    Depression    Past Surgical History:  Procedure Laterality Date   CESAREAN SECTION     WISDOM TOOTH EXTRACTION     Patient Active Problem List   Diagnosis Date Noted   Gestational diabetes 08/14/2022   Supervision of normal pregnancy, antepartum 04/16/2022   AMA (advanced maternal age) multigravida 35+ 04/16/2022   History of actinic keratosis 04/16/2022   Previous Classical Cesarean Section 04/16/2022   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 10/30/2021   Rosacea 05/14/2021   Recurrent major depressive disorder, in full remission ([place]) 10/28/2017   Cervical neuropathy 04/07/2016   Generalized anxiety disorder 04/07/2016    PCP: Nickola Major, MD  REFERRING PROVIDER: Donnamae Jude, MD   REFERRING DIAG: [phone] (ICD-10-CM) - Bilateral sciatica   Rationale for Evaluation and Treatment Rehabilitation  THERAPY DIAG:  Other low back pain  Cramp and spasm  Difficulty in walking, not elsewhere classified  Muscle weakness (generalized)  ONSET DATE: 07/22/22  SUBJECTIVE:                                                                                                                                                                                           SUBJECTIVE STATEMENT: Patient states she has been having low back pain for the past couple of months and  this has become unbearable.  She is wearing a belly band and doing some positional changes to alleviate the pain but she isn't able to find a comfortable position easily.  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  She is mostly sitting but also does some standing for training.  She denies any radicular symptoms.  She lives with her spouse and has a 103 year old.  She hopes to alleviate the pain and be able to be mobile for the next few weeks until she reaches her C section date.   PERTINENT HISTORY:  MD  notes: Susan Suarez is a 36 y.o. [phone] at 46w3dbeing seen today for ongoing prenatal care.  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   PAIN:  Are you having pain? Yes: NPRS scale: 0/10 Pain location: low back when walking or standing for long periods of time, worst is when I get up out of bed  Pain description: aching Aggravating factors: standing/walking Relieving factors: rest/meds   PRECAUTIONS: Other: pregnancy approx 30+ weeks  WEIGHT BEARING RESTRICTIONS No  FALLS:  Has patient fallen in last 6 months? No  LIVING ENVIRONMENT: Lives with: lives with their family and lives with their spouse Lives in: House/apartment   OCCUPATION: Desk job  PLOF: Independent  PATIENT GOALS to relieve pain and make it to C section date without having to be out of work or have to be bedridden   OBJECTIVE:   DIAGNOSTIC FINDINGS:  none  PATIENT SURVEYS:  Modified Oswestry 18 (moderate disability)   SCREENING FOR RED FLAGS: Bowel or bladder incontinence:  No Spinal tumors: No Cauda equina syndrome: No Compression fracture: No Abdominal aneurysm: No  COGNITION:  Overall cognitive status: Within functional limits for tasks assessed     SENSATION: WFL  MUSCLE LENGTH: Hamstrings: Right 70 deg; Left 70 deg Thomas test: Right pos ; Left pos   POSTURE: increased lumbar lordosis   LUMBAR ROM:   Active  A/PROM  eval  Flexion Deferred due to pregnancy  Extension   Right lateral flexion   Left lateral flexion   Right rotation   Left rotation    (Blank rows = not tested)  LOWER EXTREMITY ROM:     All WFL  LOWER EXTREMITY MMT:    All generally 4 to 4+/5  LUMBAR SPECIAL TESTS:  Straight leg raise test: Negative and FABER test: Positive  FUNCTIONAL TESTS:  5 times sit to stand: 8.24 sec Timed up and go (TUG): 9.30  GAIT: Distance walked: 50 Assistive device utilized: None Level of assistance: Complete Independence Comments: slow/guarded    TODAY'S TREATMENT  Initial eval completed   PATIENT EDUCATION: any treatment administered on first visit was not billed due to insurance restrictions Education details: Initiated HEP Person educated: Patient Education method: EConsulting civil engineer DMedia planner Verbal cues, and Handouts Education comprehension: verbalized understanding, returned demonstration, and verbal cues required   HOME EXERCISE PROGRAM:  Access Code: [email or web address]: [email or web address] Date: 08/19/2022 Prepared by: JCandyce Churn Exercises - Supine Hamstring Stretch with Strap  - 1 x daily - 7 x weekly - 1 sets - 5 reps - 10 hold - Supine Butterfly Groin Stretch  - 1 x daily - 7 x weekly - 1 sets - 5 reps - 10 hold - Standing Hip Flexor Stretch with Foot Elevated  - 1 x daily - 7 x weekly - 1 sets - 5 reps - 10 hold - Supine Hip Internal and External Rotation  - 1 x daily - 7 x weekly - 1 sets - 5 reps - 10 hold  ASSESSMENT:  CLINICAL IMPRESSION: Patient is a 36y.o. female who was seen  today for physical therapy evaluation and treatment for low back pain during pregnancy. She presents at 30 +weeks gestation.  She has tightness bilateral hamstrings, limited lumbar ROM and elevated pain.  She would respond well to skilled PT for LE flexibility, lumbar ROM acitivities,  instruction in use of belly band and modalities for pain relief.     OBJECTIVE IMPAIRMENTS difficulty walking, decreased ROM, decreased strength, increased fascial restrictions, increased muscle spasms, impaired flexibility, and pain.   ACTIVITY LIMITATIONS carrying, lifting, bending, sitting, standing, squatting, sleeping, stairs, transfers, and dressing  PARTICIPATION LIMITATIONS: meal prep, cleaning, laundry, driving, shopping, community activity, occupation, and yard work  PERSONAL FACTORS 1 comorbidity: pregnancy  are also affecting patient's functional outcome.   REHAB POTENTIAL: Good  CLINICAL DECISION MAKING: Stable/uncomplicated  EVALUATION COMPLEXITY: Low   GOALS: Goals reviewed with patient? Yes  SHORT TERM GOALS: Target date: 09/16/2022  Patient will be independent with initial HEP  Baseline: Goal status: INITIAL  2.  Pain report to be no greater than 4/10  Baseline:  Goal status: INITIAL  LONG TERM GOALS: Target date: 10/14/2022  Patient to be independent with advanced HEP  Baseline:  Goal status: INITIAL  2.  Patient to report pain no greater than 2/10  Baseline:  Goal status: INITIAL  3.  Oswestry score to improve by 10 points Baseline:  Goal status: INITIAL  4.  Patient to be able to transfer in/out bed without pain Baseline:  Goal status: INITIAL  5.  Patient to report 50% improvement pre-partem  Baseline:  Goal status: INITIAL  6.  Patient to report 85% improvement post partem Baseline:  Goal status: INITIAL   PLAN: PT FREQUENCY: 1-2x/week  PT DURATION: 8 weeks  PLANNED INTERVENTIONS: Therapeutic exercises, Therapeutic activity, Neuromuscular re-education,  Balance training, Gait training, Patient/Family education, Self Care, Joint mobilization, Stair training, Aquatic Therapy, Dry Needling, Spinal mobilization, Cryotherapy, Moist heat, Taping, Manual therapy, and Re-evaluation.  PLAN FOR NEXT SESSION: Review HEP, NuStep, modified core strengthening   Izreal Kock B. Numa Schroeter, PT 08/19/22 5:40 PM  [facility] [address] [place], [place] [address] Phone # [phone] Fax [phone]

## 2022-08-20 ENCOUNTER — Encounter: Payer: Medicaid Other | Attending: Family Medicine | Admitting: Registered"

## 2022-08-20 DIAGNOSIS — Z658 Other specified problems related to psychosocial circumstances: Secondary | ICD-10-CM | POA: Diagnosis present

## 2022-08-20 DIAGNOSIS — O24419 Gestational diabetes mellitus in pregnancy, unspecified control: Secondary | ICD-10-CM | POA: Insufficient documentation

## 2022-08-21 ENCOUNTER — Encounter: Payer: Self-pay | Admitting: Registered"

## 2022-08-21 NOTE — Progress Notes (Signed)
Patient was seen on 08/20/22 for Gestational Diabetes self-management class at the [facility]. The following learning objectives were met by the patient during this course:  States the definition of Gestational Diabetes States why dietary management is important in controlling blood glucose Describes the effects each nutrient has on blood glucose levels Demonstrates ability to create a balanced meal plan Demonstrates carbohydrate counting  States when to check blood glucose levels Demonstrates proper blood glucose monitoring techniques States the effect of stress and exercise on blood glucose levels States the importance of limiting caffeine and abstaining from alcohol and smoking  Blood glucose monitor given: None  Patient has meter and checking blood sugar prior to class.  Patient instructed to monitor glucose levels: FBS: 60 - <95; 1 hour: <140; 2 hour: <120  Patient received handouts: Nutrition Diabetes and Pregnancy, including carb counting list  Patient will be seen for follow-up as needed.

## 2022-08-27 ENCOUNTER — Encounter: Payer: Self-pay | Admitting: Family Medicine

## 2022-08-27 ENCOUNTER — Ambulatory Visit (INDEPENDENT_AMBULATORY_CARE_PROVIDER_SITE_OTHER): Payer: Medicaid Other | Admitting: Family Medicine

## 2022-08-27 VITALS — BP 109/78 | HR 99 | Wt 159.0 lb

## 2022-08-27 DIAGNOSIS — Z3A30 30 weeks gestation of pregnancy: Secondary | ICD-10-CM

## 2022-08-27 DIAGNOSIS — O09523 Supervision of elderly multigravida, third trimester: Secondary | ICD-10-CM

## 2022-08-27 DIAGNOSIS — O24410 Gestational diabetes mellitus in pregnancy, diet controlled: Secondary | ICD-10-CM

## 2022-08-27 DIAGNOSIS — Z98891 History of uterine scar from previous surgery: Secondary | ICD-10-CM

## 2022-08-27 DIAGNOSIS — Z23 Encounter for immunization: Secondary | ICD-10-CM

## 2022-08-27 DIAGNOSIS — Z3483 Encounter for supervision of other normal pregnancy, third trimester: Secondary | ICD-10-CM

## 2022-08-27 DIAGNOSIS — Z3480 Encounter for supervision of other normal pregnancy, unspecified trimester: Secondary | ICD-10-CM

## 2022-08-27 MED ORDER — METFORMIN HCL 500 MG PO TABS: 500.0000 mg | ORAL_TABLET | Freq: Every day | ORAL | 1 refills | Status: DC

## 2022-08-27 NOTE — Progress Notes (Signed)
ROB [redacted]w[redacted]d CC: Last couple of mornings pt has had nausea.  Pt has blood sugar log.  Flu Vaccine: Desires.

## 2022-08-27 NOTE — Progress Notes (Addendum)
PRENATAL VISIT NOTE  Subjective:  Susan Suarez is a 36 y.o. G4P1021 at 60w3dbeing seen today for ongoing prenatal care.  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  Patient reports no complaints.  Contractions: Not present. Vag. Bleeding: None.  Movement: Present. Denies leaking of fluid.   The following portions of the patient's history were reviewed and updated as appropriate: allergies, current medications, past family history, past medical history, past social history, past surgical history and problem list.   Objective:   Vitals:   08/27/22 1432  BP: 109/78  Pulse: 99  Weight: 159 lb (72.1 kg)    Fetal Status: Fetal Heart Rate (bpm): 154   Movement: Present     General:  Alert, oriented and cooperative. Patient is in no acute distress.  Skin: Skin is warm and dry. No rash noted.   Cardiovascular: Normal heart rate noted  Respiratory: Normal respiratory effort, no problems with respiration noted  Abdomen: Soft, gravid, appropriate for gestational age.  Pain/Pressure: Absent     Pelvic: Cervical exam deferred        Extremities: Normal range of motion.  Edema: Trace  Mental Status: Normal mood and affect. Normal behavior. Normal judgment and thought content.   Assessment and Plan:  Pregnancy: G4P1021 at 351w3d. Supervision of other normal pregnancy, antepartum Continue prenatal care.  2. Diet controlled gestational diabetes mellitus (GDM) in third trimester FBS are all greater than 95. 2 hour pp are mostly normal, except for a  few dietary indiscretion. Has been walking Begin Metformin to bring in CBGs--if pp are repeatedly out, can increase to bid - metFORMIN (GLUCOPHAGE) 500 MG tablet; Take 1 tablet (500 mg total) by mouth at bedtime.  Dispense: 90 tablet; Refill: 1  3. Multigravida of advanced maternal age in third trimester LR NIPT  4. Previous Classical Cesarean Section For RCS at 37 weeks, booked  5. Flu vaccine need Flu shot today - Flu Vaccine QUAD 16m41mo (Fluarix, Fluzone & Alfiuria Quad PF)  Preterm labor symptoms and general obstetric precautions including but not limited to vaginal bleeding, contractions, leaking of fluid and fetal movement were reviewed in detail with the patient. Please refer to After Visit Summary for other counseling recommendations.   Return in 2 weeks (on 09/10/2022) [facility] and [facility] weekly in 3 weeks.  Future Appointments  Date Time Provider [place]/11/2022 12:30 PM FieCandyce Churn PT [facility] None  09/04/2022 12:30 PM FieCandyce Churn PT [facility] None  09/08/2022  7:45 AM [facility] NURSE [facility]/18/2023  8:00 AM [facility] US1 [facility]/18/2023 12:30 PM Takacs, KelCraig GuessT [facility] None  09/10/2022  1:30 PM PicAletha HalimD [facility]  09/10/2022  3:30 PM FieCandyce Churn PT [facility] None  09/15/2022  3:30 PM TakDanie BinderT [facility] None  09/17/2022  3:30 PM FieIsabel CapriceT [facility] None  09/23/2022  3:30 PM FieCandyce Churn PT [facility] None  09/24/2022  1:30 PM PraDonnamae JudeD [facility]  09/25/2022  2:00 PM FieIsabel CapriceT [facility] None  09/29/2022  3:30 PM Takacs, KelCraig GuessT [facility]  None  10/01/2022  3:30 PM Isabel Caprice, PT [facility] None  10/07/2022  3:30 PM Candyce Churn B, PT [facility] None  10/08/2022  1:30 PM Donnamae Jude, MD [facility]  10/09/2022  3:30 PM Isabel Caprice, PT [facility] None  10/15/2022  1:30 PM Donnamae Jude, MD [facility]  [facility]  10/22/2022  1:30 PM Donnamae Jude, MD [facility]  11/05/2022  1:30 PM Donnamae Jude, MD [facility]    Donnamae Jude, MD

## 2022-09-02 ENCOUNTER — Ambulatory Visit: Payer: Medicaid Other | Attending: Family Medicine

## 2022-09-02 ENCOUNTER — Telehealth: Payer: Self-pay | Admitting: Clinical

## 2022-09-02 DIAGNOSIS — M6281 Muscle weakness (generalized): Secondary | ICD-10-CM | POA: Diagnosis present

## 2022-09-02 DIAGNOSIS — M5459 Other low back pain: Secondary | ICD-10-CM | POA: Insufficient documentation

## 2022-09-02 DIAGNOSIS — R262 Difficulty in walking, not elsewhere classified: Secondary | ICD-10-CM | POA: Diagnosis present

## 2022-09-02 DIAGNOSIS — R252 Cramp and spasm: Secondary | ICD-10-CM | POA: Diagnosis present

## 2022-09-02 NOTE — Therapy (Signed)
OUTPATIENT PHYSICAL THERAPY TREATMENT   Patient Name: Susan Suarez MRN: [id number] DOB:Jul 14, 1986, 36 y.o., female Today's Date: 09/02/2022   PT End of Session - 09/02/22 1241     Visit Number 2    Number of Visits 6    Date for PT Re-Evaluation 10/14/22    Authorization Type  Medicaid [employer]    Authorization Time Period [employer] Approved 6 visits, 08/19/2022-10/17/2022, auth#[id number]    Authorization - Visit Number 2    Authorization - Number of Visits 6    PT Start Time 1230    PT Stop Time 1310    PT Time Calculation (min) 40 min    Activity Tolerance Patient tolerated treatment well    Behavior During Therapy WFL for tasks assessed/performed             Past Medical History:  Diagnosis Date   Anxiety    Depression    Past Surgical History:  Procedure Laterality Date   CESAREAN SECTION     WISDOM TOOTH EXTRACTION     Patient Active Problem List   Diagnosis Date Noted   Gestational diabetes 08/14/2022   Supervision of normal pregnancy, antepartum 04/16/2022   AMA (advanced maternal age) multigravida 35+ 04/16/2022   History of actinic keratosis 04/16/2022   Previous Classical Cesarean Section 04/16/2022   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 10/30/2021   Rosacea 05/14/2021   Recurrent major depressive disorder, in full remission ([place]) 10/28/2017   Cervical neuropathy 04/07/2016   Generalized anxiety disorder 04/07/2016    PCP: Nickola Major, MD  REFERRING PROVIDER: Donnamae Jude, MD   REFERRING DIAG: [phone] (ICD-10-CM) - Bilateral sciatica   Rationale for Evaluation and Treatment Rehabilitation  THERAPY DIAG:  Other low back pain  Cramp and spasm  Difficulty in walking, not elsewhere classified  Muscle weakness (generalized)  ONSET DATE: 07/22/22  SUBJECTIVE:                                                                                                                                                                                            SUBJECTIVE STATEMENT: Patient states she has been doing "ok".  She describes the pain as intermittent.   PERTINENT HISTORY:  MD notes: Susan Suarez is a 36 y.o. G4P1021 at 15w3dbeing seen today for ongoing prenatal care.  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   PAIN:  Are you having pain? Yes: NPRS scale: 0/10 Pain location: low back when walking or standing for long periods of time, worst is when I get up out of bed  Pain description: aching Aggravating factors: standing/walking Relieving factors: rest/meds   PRECAUTIONS: Other: pregnancy approx 30+ weeks  WEIGHT BEARING RESTRICTIONS No  FALLS:  Has patient fallen in last 6 months? No  LIVING ENVIRONMENT: Lives with: lives with their family and lives with their spouse Lives in: House/apartment   OCCUPATION: Desk job  PLOF: Independent  PATIENT GOALS to relieve pain and make it to C section date without having to be out of work or have to be bedridden   OBJECTIVE:   DIAGNOSTIC FINDINGS:  none  PATIENT SURVEYS:  Modified Oswestry 18 (moderate disability)   SCREENING FOR RED FLAGS: Bowel or bladder incontinence: No Spinal tumors: No Cauda equina syndrome: No Compression fracture: No Abdominal aneurysm: No  COGNITION:  Overall cognitive status: Within functional limits for tasks assessed     SENSATION: WFL  MUSCLE LENGTH: Hamstrings: Right 70 deg; Left 70 deg Thomas test: Right pos ; Left pos   POSTURE: increased  lumbar lordosis   LUMBAR ROM:   Active  A/PROM  eval  Flexion Deferred due to pregnancy  Extension   Right lateral flexion   Left lateral flexion   Right rotation   Left rotation    (Blank rows = not tested)  LOWER EXTREMITY ROM:     All WFL  LOWER EXTREMITY MMT:    All generally 4 to 4+/5  LUMBAR SPECIAL TESTS:  Straight leg raise test: Negative and FABER test: Positive  FUNCTIONAL TESTS:  5 times sit to stand: 8.24 sec Timed up and go (TUG): 9.30  GAIT: Distance walked: 50 Assistive device utilized: None Level of assistance: Complete Independence Comments: slow/guarded    TODAY'S TREATMENT 09/02/22 Supine pelvic tilt x 20 Supine lower trunk rotation x 20 Isometric hip adduction x 20 Isometric hip abduction x 20 ITB/lateral hip stretch 3 x 30 sec each LE  Belly band instruction with Stacy Gardner, PT K tape for off loading belly with Stacy Gardner, PT  TODAY'S TREATMENT 08/19/22 Initial eval completed   PATIENT EDUCATION: any treatment administered on first visit was not billed due to insurance restrictions Education details: Initiated HEP Person educated: Patient Education method: Consulting civil engineer, Media planner, Verbal cues, and Handouts Education comprehension: verbalized understanding, returned demonstration, and verbal cues required   HOME EXERCISE PROGRAM:  Access Code: [email or web address] URL: [email or web address] Date: 08/19/2022 Prepared by: Candyce Churn  Exercises - Supine Hamstring Stretch with Strap  - 1 x daily - 7 x weekly - 1 sets - 5 reps - 10 hold - Supine Butterfly Groin Stretch  - 1 x daily - 7 x weekly - 1 sets - 5 reps - 10 hold - Standing Hip Flexor Stretch with Foot Elevated  - 1 x daily - 7 x weekly - 1 sets - 5 reps - 10 hold - Supine Hip Internal and External Rotation  - 1 x daily - 7 x weekly - 1 sets - 5 reps - 10 hold  ASSESSMENT:  CLINICAL IMPRESSION: Patient has some relief with today's activities.  She reported  less pressure at S.I. with taping and repositioning the belly band.    She would respond well to skilled PT for LE flexibility, lumbar ROM acitivities,  instruction  in use of belly band and modalities for pain relief.     OBJECTIVE IMPAIRMENTS difficulty walking, decreased ROM, decreased strength, increased fascial restrictions, increased muscle spasms, impaired flexibility, and pain.   ACTIVITY LIMITATIONS carrying, lifting, bending, sitting, standing, squatting, sleeping, stairs, transfers, and dressing  PARTICIPATION LIMITATIONS: meal prep, cleaning, laundry, driving, shopping, community activity, occupation, and yard work  PERSONAL FACTORS 1 comorbidity: pregnancy  are also affecting patient's functional outcome.   REHAB POTENTIAL: Good  CLINICAL DECISION MAKING: Stable/uncomplicated  EVALUATION COMPLEXITY: Low   GOALS: Goals reviewed with patient? Yes  SHORT TERM GOALS: Target date: 09/30/2022  Patient will be independent with initial HEP  Baseline: Goal status: INITIAL  2.  Pain report to be no greater than 4/10  Baseline:  Goal status: INITIAL  LONG TERM GOALS: Target date: 10/28/2022  Patient to be independent with advanced HEP  Baseline:  Goal status: INITIAL  2.  Patient to report pain no greater than 2/10  Baseline:  Goal status: INITIAL  3.  Oswestry score to improve by 10 points Baseline:  Goal status: INITIAL  4.  Patient to be able to transfer in/out bed without pain Baseline:  Goal status: INITIAL  5.  Patient to report 50% improvement pre-partem  Baseline:  Goal status: INITIAL  6.  Patient to report 85% improvement post partem Baseline:  Goal status: INITIAL   PLAN: PT FREQUENCY: 1-2x/week  PT DURATION: 8 weeks  PLANNED INTERVENTIONS: Therapeutic exercises, Therapeutic activity, Neuromuscular re-education, Balance training, Gait training, Patient/Family education, Self Care, Joint mobilization, Stair training, Aquatic Therapy, Dry  Needling, Spinal mobilization, Cryotherapy, Moist heat, Taping, Manual therapy, and Re-evaluation.  PLAN FOR NEXT SESSION: Review HEP, NuStep, modified core strengthening   Mahin Guardia B. Karrigan Messamore, PT 09/02/22 1:14 PM  [facility] [address], [address] [place], [place] [address] Phone # [phone] Fax [phone]

## 2022-09-02 NOTE — Telephone Encounter (Signed)
Attempt call regarding referral; Left HIPPA-compliant message to call back Lajeana Strough from [facility] at  [phone] (Ira Dougher's office).    

## 2022-09-04 ENCOUNTER — Ambulatory Visit: Payer: Medicaid Other

## 2022-09-04 DIAGNOSIS — M5459 Other low back pain: Secondary | ICD-10-CM

## 2022-09-04 DIAGNOSIS — M6281 Muscle weakness (generalized): Secondary | ICD-10-CM

## 2022-09-04 DIAGNOSIS — R252 Cramp and spasm: Secondary | ICD-10-CM

## 2022-09-04 DIAGNOSIS — R262 Difficulty in walking, not elsewhere classified: Secondary | ICD-10-CM

## 2022-09-04 NOTE — Therapy (Signed)
OUTPATIENT PHYSICAL THERAPY TREATMENT   Patient Name: Susan Suarez MRN: [id number] DOB:[birth year], 36 y.o., female Today's Date: 09/04/2022   PT End of Session - 09/04/22 1241     Visit Number 3    Number of Visits 6    Date for PT Re-Evaluation 10/14/22    Authorization Type [place] Medicaid [employer]    Authorization Time Period [employer] Approved 6 visits, 08/19/2022-10/17/2022, auth#[id number]    Authorization - Visit Number 3    Authorization - Number of Visits 6    PT Start Time 1240    PT Stop Time 1315    PT Time Calculation (min) 35 min    Activity Tolerance Patient tolerated treatment well    Behavior During Therapy WFL for tasks assessed/performed             Past Medical History:  Diagnosis Date   Anxiety    Depression    Past Surgical History:  Procedure Laterality Date   CESAREAN SECTION     WISDOM TOOTH EXTRACTION     Patient Active Problem List   Diagnosis Date Noted   Gestational diabetes 08/14/2022   Supervision of normal pregnancy, antepartum 04/16/2022   AMA (advanced maternal age) multigravida 35+ 04/16/2022   History of actinic keratosis 04/16/2022   Previous Classical Cesarean Section 04/16/2022   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 10/30/2021   Rosacea 05/14/2021   Recurrent major depressive disorder, in full remission ([place]) 10/28/2017   Cervical neuropathy 04/07/2016   Generalized anxiety disorder 04/07/2016    PCP: Nickola Major, MD  REFERRING PROVIDER: Donnamae Jude, MD   REFERRING DIAG: [phone] (ICD-10-CM) - Bilateral sciatica   Rationale for Evaluation and Treatment Rehabilitation  THERAPY DIAG:  Other low back pain  Cramp and spasm  Difficulty in walking, not elsewhere classified  Muscle weakness (generalized)  ONSET DATE: 07/22/22  SUBJECTIVE:                                                                                                                                                                                            SUBJECTIVE STATEMENT: 10 min late. Patient states she has been doing "good".  She describes the pain as intermittent.  States the taping helped.  She bought some tape and is going to try it at home.    PERTINENT HISTORY:  MD notes: Susan Suarez is a 36 y.o. G4P1021 at 61w3dbeing seen today for ongoing prenatal care.  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   PAIN:  Are you having pain? Yes: NPRS scale: 0/10 Pain location: low back when walking or standing for long periods of time, worst is when I get up out of bed  Pain description: aching Aggravating factors: standing/walking Relieving factors: rest/meds   PRECAUTIONS: Other: pregnancy approx 30+ weeks  WEIGHT BEARING RESTRICTIONS No  FALLS:  Has patient fallen in last 6 months? No  LIVING ENVIRONMENT: Lives with: lives with their family and lives with their spouse Lives in: House/apartment   OCCUPATION: Desk job  PLOF: Independent  PATIENT GOALS to relieve pain and make it to C section date without having to be out of work or have to be bedridden   OBJECTIVE:   DIAGNOSTIC FINDINGS:  none  PATIENT SURVEYS:  Modified Oswestry 18 (moderate disability)   SCREENING FOR RED FLAGS: Bowel or bladder incontinence: No Spinal tumors: No Cauda equina syndrome: No Compression fracture: No Abdominal aneurysm: No  COGNITION:  Overall cognitive status: Within functional limits for tasks assessed     SENSATION: WFL  MUSCLE  LENGTH: Hamstrings: Right 70 deg; Left 70 deg Thomas test: Right pos ; Left pos   POSTURE: increased lumbar lordosis   LUMBAR ROM:   Active  A/PROM  eval  Flexion Deferred due to pregnancy  Extension   Right lateral flexion   Left lateral flexion   Right rotation   Left rotation    (Blank rows = not tested)  LOWER EXTREMITY ROM:     All WFL  LOWER EXTREMITY MMT:    All generally 4 to 4+/5  LUMBAR SPECIAL TESTS:  Straight leg raise test: Negative and FABER test: Positive  FUNCTIONAL TESTS:  5 times sit to stand: 8.24 sec Timed up and go (TUG): 9.30  GAIT: Distance walked: 50 Assistive device utilized: None Level of assistance: Complete Independence Comments: slow/guarded    TODAY'S TREATMENT 09/04/22 Nustep x 5 min level 2  PT present to discuss progress Standing palloff press x 10 each side with red band Supine pelvic tilt x 20 Supine lower trunk rotation x 20 Combined hip IR/ER stretch in hooklying x 5 each side  Isometric hip adduction x 20 Isometric hip abduction x 20 ITB/lateral hip stretch 3 x 30 sec each LE   TODAY'S TREATMENT 09/02/22 Supine pelvic tilt x 20 Supine lower trunk rotation x 20 Isometric hip adduction x 20 Isometric hip abduction x 20 ITB/lateral hip stretch 3 x 30 sec each LE  Belly band instruction with Stacy Gardner, PT K tape for off loading belly with Stacy Gardner, PT  TODAY'S TREATMENT 08/19/22 Initial eval completed   PATIENT EDUCATION: any treatment administered on first visit was not billed due to insurance restrictions Education details: Initiated HEP Person educated: Patient Education method: Consulting civil engineer, Media planner, Verbal cues, and Handouts Education comprehension: verbalized understanding, returned demonstration, and verbal cues required   HOME EXERCISE PROGRAM:  Access Code: [email or web address] URL: [email or web address] Date: 08/19/2022 Prepared by: Candyce Churn  Exercises - Supine Hamstring  Stretch with Strap  - 1 x daily - 7 x weekly - 1 sets - 5 reps - 10 hold - Supine Butterfly Groin Stretch  - 1 x daily - 7 x weekly - 1 sets - 5 reps - 10 hold - Standing Hip Flexor Stretch  with Foot Elevated  - 1 x daily - 7 x weekly - 1 sets - 5 reps - 10 hold - Supine Hip Internal and External Rotation  - 1 x daily - 7 x weekly - 1 sets - 5 reps - 10 hold  ASSESSMENT:  CLINICAL IMPRESSION: Susan Suarez is progressing appropriately.   She should continue to respond well to skilled PT for LE flexibility, lumbar ROM acitivities,  instruction in use of belly band and modalities for pain relief.     OBJECTIVE IMPAIRMENTS difficulty walking, decreased ROM, decreased strength, increased fascial restrictions, increased muscle spasms, impaired flexibility, and pain.   ACTIVITY LIMITATIONS carrying, lifting, bending, sitting, standing, squatting, sleeping, stairs, transfers, and dressing  PARTICIPATION LIMITATIONS: meal prep, cleaning, laundry, driving, shopping, community activity, occupation, and yard work  PERSONAL FACTORS 1 comorbidity: pregnancy  are also affecting patient's functional outcome.   REHAB POTENTIAL: Good  CLINICAL DECISION MAKING: Stable/uncomplicated  EVALUATION COMPLEXITY: Low   GOALS: Goals reviewed with patient? Yes  SHORT TERM GOALS: Target date: 09/16/2022  Patient will be independent with initial HEP  Baseline: Goal status: INITIAL  2.  Pain report to be no greater than 4/10  Baseline:  Goal status: INITIAL  LONG TERM GOALS: Target date: 10/14/2022  Patient to be independent with advanced HEP  Baseline:  Goal status: INITIAL  2.  Patient to report pain no greater than 2/10  Baseline:  Goal status: INITIAL  3.  Oswestry score to improve by 10 points Baseline:  Goal status: INITIAL  4.  Patient to be able to transfer in/out bed without pain Baseline:  Goal status: INITIAL  5.  Patient to report 50% improvement pre-partem  Baseline:  Goal status:  INITIAL  6.  Patient to report 85% improvement post partem Baseline:  Goal status: INITIAL   PLAN: PT FREQUENCY: 1-2x/week  PT DURATION: 8 weeks  PLANNED INTERVENTIONS: Therapeutic exercises, Therapeutic activity, Neuromuscular re-education, Balance training, Gait training, Patient/Family education, Self Care, Joint mobilization, Stair training, Aquatic Therapy, Dry Needling, Spinal mobilization, Cryotherapy, Moist heat, Taping, Manual therapy, and Re-evaluation.  PLAN FOR NEXT SESSION:  NuStep, modified core strengthening, MET's   Susan Suarez, PT 09/04/22 1:25 PM  [facility] [address] [place], [place] [address] Phone # [phone] Fax [phone]

## 2022-09-08 ENCOUNTER — Ambulatory Visit: Payer: Medicaid Other | Admitting: *Deleted

## 2022-09-08 ENCOUNTER — Ambulatory Visit: Payer: Medicaid Other | Attending: Obstetrics and Gynecology

## 2022-09-08 ENCOUNTER — Ambulatory Visit: Payer: Medicaid Other

## 2022-09-08 ENCOUNTER — Other Ambulatory Visit: Payer: Self-pay | Admitting: *Deleted

## 2022-09-08 ENCOUNTER — Other Ambulatory Visit: Payer: Self-pay | Admitting: Obstetrics and Gynecology

## 2022-09-08 VITALS — BP 109/65 | HR 93

## 2022-09-08 DIAGNOSIS — O24415 Gestational diabetes mellitus in pregnancy, controlled by oral hypoglycemic drugs: Secondary | ICD-10-CM | POA: Insufficient documentation

## 2022-09-08 DIAGNOSIS — Z362 Encounter for other antenatal screening follow-up: Secondary | ICD-10-CM | POA: Insufficient documentation

## 2022-09-08 DIAGNOSIS — O34219 Maternal care for unspecified type scar from previous cesarean delivery: Secondary | ICD-10-CM

## 2022-09-08 DIAGNOSIS — Z3A32 32 weeks gestation of pregnancy: Secondary | ICD-10-CM | POA: Diagnosis not present

## 2022-09-08 DIAGNOSIS — O09523 Supervision of elderly multigravida, third trimester: Secondary | ICD-10-CM | POA: Diagnosis not present

## 2022-09-08 DIAGNOSIS — R262 Difficulty in walking, not elsewhere classified: Secondary | ICD-10-CM

## 2022-09-08 DIAGNOSIS — Z98891 History of uterine scar from previous surgery: Secondary | ICD-10-CM

## 2022-09-08 DIAGNOSIS — M6281 Muscle weakness (generalized): Secondary | ICD-10-CM

## 2022-09-08 DIAGNOSIS — R252 Cramp and spasm: Secondary | ICD-10-CM

## 2022-09-08 DIAGNOSIS — M5459 Other low back pain: Secondary | ICD-10-CM

## 2022-09-08 NOTE — Therapy (Signed)
OUTPATIENT PHYSICAL THERAPY TREATMENT   Patient Name: Susan Suarez MRN: [id number] DOB:[birth year], 36 y.o., female Today's Date: 09/08/2022   PT End of Session - 09/08/22 1314     Visit Number 4   no treatment on the first visit   Number of Visits 6    Date for PT Re-Evaluation 10/14/22    Authorization Type [place] Medicaid [employer]    Authorization Time Period [employer] Approved 6 visits, 08/19/2022-10/17/2022, auth#[id number]    Authorization - Visit Number 3   no treatment on the first visit   Authorization - Number of Visits 6    PT Start Time 1235    PT Stop Time 1317    PT Time Calculation (min) 42 min    Activity Tolerance Patient tolerated treatment well    Behavior During Therapy WFL for tasks assessed/performed              Past Medical History:  Diagnosis Date   Anxiety    Depression    Past Surgical History:  Procedure Laterality Date   [place] EXTRACTION     Patient Active Problem List   Diagnosis Date Noted   Gestational diabetes 08/14/2022   Supervision of normal pregnancy, antepartum 04/16/2022   AMA (advanced maternal age) multigravida 35+ 04/16/2022   History of actinic keratosis 04/16/2022   Previous Classical Cesarean Section 04/16/2022   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 10/30/2021   Rosacea 05/14/2021   Recurrent major depressive disorder, in full remission ([place]) 10/28/2017   Cervical neuropathy 04/07/2016   Generalized anxiety disorder 04/07/2016    PCP: Nickola Major, MD  REFERRING PROVIDER: Donnamae Jude, MD   REFERRING DIAG: [phone] (ICD-10-CM) - Bilateral sciatica   Rationale for Evaluation and Treatment Rehabilitation  THERAPY DIAG:  Other low back pain  Cramp and spasm  Difficulty in walking, not elsewhere classified  Muscle weakness (generalized)  ONSET DATE: 07/22/22  SUBJECTIVE:                                                                                                                                                                                            SUBJECTIVE STATEMENT: I was busy over the weekend with my baby shower so I had some increased pain.  I feel 25% better overall.   PERTINENT HISTORY:  MD notes: Susan Suarez is a 36 y.o. G4P1021 at 66w3dbeing seen today for ongoing prenatal care.  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   PAIN:  Are you having pain? Yes: NPRS scale: 0/10 Pain location: low back when walking or standing for long periods of time, worst is when I get up out of bed  Pain description: aching Aggravating factors: standing/walking Relieving factors: rest/meds   PRECAUTIONS: Other: pregnancy approx 30+ weeks  WEIGHT BEARING RESTRICTIONS No  FALLS:  Has patient fallen in last 6 months? No  LIVING ENVIRONMENT: Lives with: lives with their family and lives with their spouse Lives in: House/apartment  OCCUPATION: Desk job  PLOF: Independent  PATIENT GOALS to relieve pain and make it to C section date without having to be out of work or have to be bedridden  OBJECTIVE:   DIAGNOSTIC FINDINGS:  none  PATIENT SURVEYS:  Modified Oswestry 18 (moderate disability)   SCREENING FOR RED FLAGS: Bowel or bladder incontinence: No Spinal tumors: No Cauda equina syndrome: No Compression fracture: No Abdominal aneurysm: No  COGNITION:  Overall cognitive status: Within functional limits for tasks assessed     SENSATION: WFL  MUSCLE  LENGTH: Hamstrings: Right 70 deg; Left 70 deg Thomas test: Right pos ; Left pos   POSTURE: increased lumbar lordosis   LUMBAR ROM:   Active  A/PROM  eval  Flexion Deferred due to pregnancy  Extension   Right lateral flexion   Left lateral flexion   Right rotation   Left rotation    (Blank rows = not tested)  LOWER EXTREMITY ROM:    All WFL  LOWER EXTREMITY MMT:   All generally 4 to 4+/5  LUMBAR SPECIAL TESTS:  Straight leg raise test: Negative and FABER test: Positive  FUNCTIONAL TESTS:  5 times sit to stand: 8.24 sec Timed up and go (TUG): 9.30  GAIT: Distance walked: 50 Assistive device utilized: None Level of assistance: Complete Independence Comments: slow/guarded  TODAY'S TREATMENT 09/04/22 Nustep x 6 min level 2  PT present to discuss progress Standing palloff press x 10 each side with red band Supine pelvic tilt x 20 Supine lower trunk rotation x 20 Combined hip IR/ER stretch in hooklying x 5 each side  Isometric hip adduction x 20 Isometric hip abduction x 20 ITB/lateral hip stretch 3 x 30 sec each LE  Manual: K tape for off loading belly with Earlie Counts, PT  TODAY'S TREATMENT 09/04/22 Nustep x 5 min level 2  PT present to discuss progress Standing palloff press x 10 each side with red band Supine pelvic tilt x 20 Supine lower trunk rotation x 20 Combined hip IR/ER stretch in hooklying x 5 each side  Isometric hip adduction x 20 Isometric hip abduction x 20 ITB/lateral hip stretch 3 x 30 sec each LE   TODAY'S TREATMENT 09/02/22 Supine pelvic tilt x 20 Supine lower trunk rotation x 20 Isometric hip adduction x 20 Isometric hip abduction x 20 ITB/lateral hip stretch 3 x 30 sec each LE  Belly band instruction with Stacy Gardner, PT K tape for off loading belly with Stacy Gardner, PT   PATIENT EDUCATION: any treatment administered on first visit was not billed due to insurance restrictions Education details: Initiated HEP Person educated:  Patient Education method: Consulting civil engineer, Media planner, Verbal cues, and Handouts Education comprehension: verbalized understanding, returned demonstration, and verbal cues required   HOME EXERCISE  PROGRAM:  Access Code: [email or web address] URL: [email or web address] Date: 08/19/2022 Prepared by: Candyce Churn  Exercises - Supine Hamstring Stretch with Strap  - 1 x daily - 7 x weekly - 1 sets - 5 reps - 10 hold - Supine Butterfly Groin Stretch  - 1 x daily - 7 x weekly - 1 sets - 5 reps - 10 hold - Standing Hip Flexor Stretch with Foot Elevated  - 1 x daily - 7 x weekly - 1 sets - 5 reps - 10 hold - Supine Hip Internal and External Rotation  - 1 x daily - 7 x weekly - 1 sets - 5 reps - 10 hold  ASSESSMENT:  CLINICAL IMPRESSION: Pt reports 25% overall improvement since the start of care.  Pt reports that she experiences 6/10 lumbar pain with getting in/out of bed and with daily activities.  PT provided scar massage info for after C-section and applied K-tape for support. She should continue to respond well to skilled PT for LE flexibility, lumbar ROM acitivities,  instruction in use of belly band and modalities for pain relief.     OBJECTIVE IMPAIRMENTS difficulty walking, decreased ROM, decreased strength, increased fascial restrictions, increased muscle spasms, impaired flexibility, and pain.   ACTIVITY LIMITATIONS carrying, lifting, bending, sitting, standing, squatting, sleeping, stairs, transfers, and dressing  PARTICIPATION LIMITATIONS: meal prep, cleaning, laundry, driving, shopping, community activity, occupation, and yard work  PERSONAL FACTORS 1 comorbidity: pregnancy  are also affecting patient's functional outcome.   REHAB POTENTIAL: Good  CLINICAL DECISION MAKING: Stable/uncomplicated  EVALUATION COMPLEXITY: Low   GOALS: Goals reviewed with patient? Yes  SHORT TERM GOALS: Target date: 09/16/2022  Patient will be independent with initial HEP  Baseline: Goal  status: MET  2.  Pain report to be no greater than 4/10  Baseline: 6/10 max  Goal status: in progress   LONG TERM GOALS: Target date: 10/14/2022  Patient to be independent with advanced HEP  Baseline:  Goal status: INITIAL  2.  Patient to report pain no greater than 2/10  Baseline:  Goal status: INITIAL  3.  Oswestry score to improve by 10 points Baseline:  Goal status: INITIAL  4.  Patient to be able to transfer in/out bed without pain Baseline: 6/10 with good technique Goal status: In progress   5.  Patient to report 50% improvement pre-partem  Baseline: 25% (09/08/22) Goal status: INITIAL  6.  Patient to report 85% improvement post partem Baseline:  Goal status: INITIAL   PLAN: PT FREQUENCY: 1-2x/week  PT DURATION: 8 weeks  PLANNED INTERVENTIONS: Therapeutic exercises, Therapeutic activity, Neuromuscular re-education, Balance training, Gait training, Patient/Family education, Self Care, Joint mobilization, Stair training, Aquatic Therapy, Dry Needling, Spinal mobilization, Cryotherapy, Moist heat, Taping, Manual therapy, and Re-evaluation.  PLAN FOR NEXT SESSION:  NuStep, modified core strengthening, MET's  Sigurd Sos, PT 09/08/22 1:19 PM  [facility] [address] [place], [place] [address] Phone # [phone] Fax [phone]

## 2022-09-10 ENCOUNTER — Ambulatory Visit (INDEPENDENT_AMBULATORY_CARE_PROVIDER_SITE_OTHER): Payer: Medicaid Other | Admitting: Obstetrics and Gynecology

## 2022-09-10 ENCOUNTER — Ambulatory Visit: Payer: Medicaid Other

## 2022-09-10 VITALS — BP 104/67 | HR 108 | Wt 160.0 lb

## 2022-09-10 DIAGNOSIS — O09523 Supervision of elderly multigravida, third trimester: Secondary | ICD-10-CM

## 2022-09-10 DIAGNOSIS — O24419 Gestational diabetes mellitus in pregnancy, unspecified control: Secondary | ICD-10-CM

## 2022-09-10 DIAGNOSIS — M5459 Other low back pain: Secondary | ICD-10-CM

## 2022-09-10 DIAGNOSIS — O24410 Gestational diabetes mellitus in pregnancy, diet controlled: Secondary | ICD-10-CM

## 2022-09-10 DIAGNOSIS — Z3A32 32 weeks gestation of pregnancy: Secondary | ICD-10-CM

## 2022-09-10 DIAGNOSIS — Z98891 History of uterine scar from previous surgery: Secondary | ICD-10-CM

## 2022-09-10 DIAGNOSIS — R252 Cramp and spasm: Secondary | ICD-10-CM

## 2022-09-10 DIAGNOSIS — M6281 Muscle weakness (generalized): Secondary | ICD-10-CM

## 2022-09-10 DIAGNOSIS — R262 Difficulty in walking, not elsewhere classified: Secondary | ICD-10-CM

## 2022-09-10 MED ORDER — METFORMIN HCL 500 MG PO TABS: 1000.0000 mg | ORAL_TABLET | Freq: Every day | ORAL | 1 refills | Status: DC

## 2022-09-10 NOTE — Therapy (Signed)
OUTPATIENT PHYSICAL THERAPY TREATMENT   Patient Name: Susan Suarez MRN: [id number] DOB:02-01-1986, 36 y.o., female Today's Date: 09/10/2022   PT End of Session - 09/10/22 1543     Visit Number 5    Number of Visits 6    Date for PT Re-Evaluation 10/14/22    Authorization Type [place] Medicaid [employer]    Authorization Time Period [employer] Approved 6 visits, 08/19/2022-10/17/2022, auth#[id number]    Authorization - Visit Number 5    Authorization - Number of Visits 6    Progress Note Due on Visit 5    PT Start Time 1535    PT Stop Time 1615    PT Time Calculation (min) 40 min    Activity Tolerance Patient tolerated treatment well    Behavior During Therapy WFL for tasks assessed/performed              Past Medical History:  Diagnosis Date   Anxiety    Depression    Past Surgical History:  Procedure Laterality Date   CESAREAN SECTION     WISDOM TOOTH EXTRACTION     Patient Active Problem List   Diagnosis Date Noted   Gestational diabetes 08/14/2022   Supervision of normal pregnancy, antepartum 04/16/2022   AMA (advanced maternal age) multigravida 35+ 04/16/2022   History of actinic keratosis 04/16/2022   Previous Classical Cesarean Section 04/16/2022   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 10/30/2021   Rosacea 05/14/2021   Recurrent major depressive disorder, in full remission ([place]) 10/28/2017   Cervical neuropathy 04/07/2016   Generalized anxiety disorder 04/07/2016    PCP: Nickola Major, MD  REFERRING PROVIDER: Donnamae Jude, MD   REFERRING DIAG: [phone] (ICD-10-CM) - Bilateral sciatica   Rationale for Evaluation and Treatment Rehabilitation  THERAPY DIAG:  Other low back pain  Cramp and spasm  Difficulty in walking, not elsewhere classified  Muscle weakness (generalized)  ONSET DATE: 07/22/22  SUBJECTIVE:                                                                                                                                                                                            SUBJECTIVE STATEMENT: I was busy over the weekend with my baby shower so I had some increased pain.  I feel 25% better overall.   PERTINENT HISTORY:  MD notes: Susan Suarez is a 36 y.o. G4P1021 at 38w3dbeing seen today for ongoing prenatal care.  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   PAIN:  Are you having pain? Yes: NPRS scale: 0/10 Pain location: low back when walking or standing for long periods of time, worst is when I get up out of bed  Pain description: aching Aggravating factors: standing/walking Relieving factors: rest/meds   PRECAUTIONS: Other: pregnancy approx 30+ weeks  WEIGHT BEARING RESTRICTIONS No  FALLS:  Has patient fallen in last 6 months? No  LIVING ENVIRONMENT: Lives with: lives with their family and lives with their spouse Lives in: House/apartment  OCCUPATION: Desk job  PLOF: Independent  PATIENT GOALS to relieve pain and make it to C section date without having to be out of work or have to be bedridden  OBJECTIVE:   DIAGNOSTIC FINDINGS:  none  PATIENT SURVEYS:  Initial eval: Modified Oswestry 18 (moderate disability)  09/10/22:  Modified Oswestry 10(mild disability)   SCREENING FOR RED FLAGS: Bowel or bladder incontinence: No Spinal tumors: No Cauda equina syndrome: No Compression fracture: No Abdominal aneurysm: No  COGNITION:  Overall cognitive status: Within functional limits for tasks  assessed     SENSATION: WFL  MUSCLE LENGTH: Hamstrings: Right 75 deg; Left 75 deg Thomas test: Right pos ; Left pos   POSTURE: increased lumbar lordosis   LUMBAR ROM:   Active  A/PROM  eval  Flexion Deferred due to pregnancy  Extension   Right lateral flexion   Left lateral flexion   Right rotation   Left rotation    (Blank rows = not tested)  LOWER EXTREMITY ROM:    All WFL  LOWER EXTREMITY MMT:   All generally 4+/5  LUMBAR SPECIAL TESTS:  Straight leg raise test: Negative and FABER test: Positive  FUNCTIONAL TESTS:  Initial eval: 5 times sit to stand: 8.24 sec Timed up and go (TUG): 9.30   09/10/22:   5 times sit to stand: 7.77 sec Timed up and go (TUG): 6.00 GAIT: Distance walked: 50 Assistive device utilized: None Level of assistance: Complete Independence Comments: slow/guarded  TODAY'S TREATMENT 09/10/22 Runners stretch position with one leg off edge of bed: hamstring and adductor stretch x 3 holding 30 sec Quadruped cat/cow x 10 Cat with childs pose with hips abducted x 3 hold 30 sec Supine pelvic tilt x 20 Supine lower trunk rotation x 20 Re-assessment for additional visits request  TODAY'S TREATMENT 09/04/22 Nustep x 6 min level 2  PT present to discuss progress Standing palloff press x 10 each side with red band Supine pelvic tilt x 20 Supine lower trunk rotation x 20 Combined hip IR/ER stretch in hooklying x 5 each side  Isometric hip adduction x 20 Isometric hip abduction x 20 ITB/lateral hip stretch 3 x 30 sec each LE  Manual: K tape for off loading belly with Earlie Counts, PT  TODAY'S TREATMENT 09/04/22 Nustep x 5 min level 2  PT present to discuss progress Standing palloff press x 10 each side with red band Supine pelvic tilt x 20 Supine lower trunk rotation x 20 Combined hip IR/ER stretch in hooklying x 5 each side  Isometric hip adduction x 20 Isometric hip abduction x 20 ITB/lateral hip stretch 3 x 30 sec each LE   PATIENT  EDUCATION: any treatment administered on first visit was not billed due to insurance restrictions Education details: Initiated HEP Person educated:  Patient Education method: Explanation, Demonstration, Verbal cues, and Handouts Education comprehension: verbalized understanding, returned demonstration, and verbal cues required   HOME EXERCISE PROGRAM:  Access Code: [email or web address] URL: [email or web address] Date: 08/19/2022 Prepared by: Candyce Churn  Exercises - Supine Hamstring Stretch with Strap  - 1 x daily - 7 x weekly - 1 sets - 5 reps - 10 hold - Supine Butterfly Groin Stretch  - 1 x daily - 7 x weekly - 1 sets - 5 reps - 10 hold - Standing Hip Flexor Stretch with Foot Elevated  - 1 x daily - 7 x weekly - 1 sets - 5 reps - 10 hold - Supine Hip Internal and External Rotation  - 1 x daily - 7 x weekly - 1 sets - 5 reps - 10 hold  ASSESSMENT:  CLINICAL IMPRESSION: Pt reports 50% overall improvement since the start of care.  Pt reports that she experiences 6/10 lumbar pain with getting in/out of bed and with daily activities but this is not as frequent as it has been in the past few weeks.  She demonstrates improvement in all functional tests as well.  She should continue to respond well to skilled PT for LE flexibility, lumbar ROM acitivities,  instruction in use of belly band and modalities for pain relief to allow her to make it to her due date/scheduled C section without having to be placed on bed rest.     OBJECTIVE IMPAIRMENTS difficulty walking, decreased ROM, decreased strength, increased fascial restrictions, increased muscle spasms, impaired flexibility, and pain.   ACTIVITY LIMITATIONS carrying, lifting, bending, sitting, standing, squatting, sleeping, stairs, transfers, and dressing  PARTICIPATION LIMITATIONS: meal prep, cleaning, laundry, driving, shopping, community activity, occupation, and yard work  PERSONAL FACTORS 1 comorbidity: pregnancy  are also  affecting patient's functional outcome.   REHAB POTENTIAL: Good  CLINICAL DECISION MAKING: Stable/uncomplicated  EVALUATION COMPLEXITY: Low   GOALS: Goals reviewed with patient? Yes  SHORT TERM GOALS: Target date: 09/16/2022  Patient will be independent with initial HEP  Baseline: Goal status: MET  2.  Pain report to be no greater than 4/10  Baseline: 6/10 max  Goal status: in progress   LONG TERM GOALS: Target date: 10/14/2022  Patient to be independent with advanced HEP  Baseline:  Goal status: MET  2.  Patient to report pain no greater than 2/10  Baseline:  Goal status: IN PROGRESS  3.  Oswestry score to improve by 10 points Baseline:  Goal status: IN PROGRESS  4.  Patient to be able to transfer in/out bed without pain Baseline: 6/10 with good technique Goal status: In progress   5.  Patient to report 50% improvement pre-partem  Baseline: 25% (09/08/22) Goal status: MET  6.  Patient to report 85% improvement post partem Baseline:  Goal status: IN PROGRESS   PLAN: PT FREQUENCY: 1-2x/week  PT DURATION: 8 weeks  PLANNED INTERVENTIONS: Therapeutic exercises, Therapeutic activity, Neuromuscular re-education, Balance training, Gait training, Patient/Family education, Self Care, Joint mobilization, Stair training, Aquatic Therapy, Dry Needling, Spinal mobilization, Cryotherapy, Moist heat, Taping, Manual therapy, and Re-evaluation.  PLAN FOR NEXT SESSION:  NuStep, modified core strengthening, MET's, taping for off loading belly.   Anderson Malta B. Adrijana Haros, PT 09/10/22 5:19 PM  [facility] [address] [place], [place] [address] Phone # [phone] Fax [phone]

## 2022-09-10 NOTE — Progress Notes (Unsigned)
ROB [redacted]w[redacted]d CC: Cold Sx's, Congested.   Pt states she is eating properly yet sugars are never what they should they are always elevated and wants to discuss medication management.  Fasting's are always in the 100s. Pt brought blood sugar log   Pt also wants to discuss weekly U/S. Recently had scan on 09/08/22.

## 2022-09-11 NOTE — Progress Notes (Signed)
PRENATAL VISIT NOTE  Subjective:  Susan Suarez is a 36 y.o. G4P1021 at 47w3dbeing seen today for ongoing prenatal care.  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  Patient reports  mild URI s/s .  Contractions: Irritability. Vag. Bleeding: None.  Movement: Present. Denies leaking of fluid.   The following portions of the patient's history were reviewed and updated as appropriate: allergies, current medications, past family history, past medical history, past social history, past surgical history and problem list.   Objective:   Vitals:   09/10/22 1337  BP: 104/67  Pulse: (!) 108  Weight: 160 lb (72.6 kg)    Fetal Status: Fetal Heart Rate (bpm): 152   Movement: Present     General:  Alert, oriented and cooperative. Patient is in no acute distress.  Skin: Skin is warm and dry. No rash noted.   Cardiovascular: Normal heart rate noted  Respiratory: Normal respiratory effort, no problems with respiration noted  Abdomen: Soft, gravid, appropriate for gestational age.  Pain/Pressure: Present     Pelvic: Cervical exam deferred        Extremities: Normal range of motion.  Edema: None  Mental Status: Normal mood and affect. Normal behavior. Normal judgment and thought content.   Assessment and Plan:  Pregnancy: [id number] 325w3d. GDMA2 in third trimester On Metformin 500 qhs and AM fastings still elevated in the 100s; normal 2h post prandials. She does not snack overnight. Increase to 1000 qhs and DM education visit in two weeks in case she  needs to start low dose qhs insulin.  9/18: 60% efw, 2043gm, ac 91%, normal afi and 8/8 bpp. Continue qwk testing - metFORMIN (GLUCOPHAGE) 500 MG tablet; Take 2 tablets (1,000 mg total) by mouth at bedtime.  Dispense: 90 tablet; Refill: 1  2. [redacted] weeks gestation of pregnancy  3. Previous Classical Cesarean Section For delivery at 37wks. D/w her re: BTL and pt to consider and   4. Multigravida of advanced maternal age in third trimester  Preterm labor symptoms and general obstetric precautions including but not limited to vaginal bleeding, contractions, leaking of fluid and fetal movement were reviewed in detail with the patient. Please refer to After Visit Summary for other counseling recommendations.   No follow-ups on file.  Future Appointments  Date Time Provider De[place]9/25/2023  3:30 PM TaDanie BinderPT [facility] None  09/16/2022  3:30 PM [facility] NURSE [facility]26[facility]  3:45 PM [facility]27[facility]  3:30 PM FiCandyce Churn, PT [facility] None  09/22/2022  3:00 PM [facility] NURSE [facility]/01/2022  3:45 PM [facility]10/02/2022  3:30 PM FiCandyce Churn, PT [facility] None  09/24/2022  1:30 PM PrDonnamae JudeMD [facility]  09/25/2022  2:00 PM FiCandyce Churn, PT [facility] None  09/29/2022  3:30 PM TaDanie BinderPT [facility] None  09/30/2022  7:15 AM [facility] NURSE [facility]/09/2022  7:30 AM [facility] US2 [facility]10/01/2022  3:30 PM FiCandyce Churn, PT [facility] None  10/06/2022  7:15 AM [facility]URSE [facility]  7:30 AM [facility] US[facility]  10/07/2022  3:30 PM Candyce Churn B, PT [facility] None  10/08/2022  1:30 PM Donnamae Jude, MD [facility]  10/09/2022  3:30 PM Isabel Caprice, PT [facility] None  10/14/2022  7:15 AM [facility] NURSE [facility]  10/14/2022  7:30 AM [facility] US2 [facility]  10/15/2022  1:30 PM Donnamae Jude, MD [facility]   10/22/2022  1:30 PM Donnamae Jude, MD [facility]  11/05/2022  1:30 PM Donnamae Jude, MD [facility]    Aletha Halim, MD

## 2022-09-15 ENCOUNTER — Ambulatory Visit: Payer: Medicaid Other

## 2022-09-16 ENCOUNTER — Ambulatory Visit: Payer: Medicaid Other | Attending: Obstetrics

## 2022-09-16 ENCOUNTER — Ambulatory Visit: Payer: Medicaid Other | Admitting: *Deleted

## 2022-09-16 ENCOUNTER — Encounter: Payer: Self-pay | Admitting: *Deleted

## 2022-09-16 VITALS — BP 122/69 | HR 78

## 2022-09-16 DIAGNOSIS — O24415 Gestational diabetes mellitus in pregnancy, controlled by oral hypoglycemic drugs: Secondary | ICD-10-CM | POA: Diagnosis present

## 2022-09-16 DIAGNOSIS — Z98891 History of uterine scar from previous surgery: Secondary | ICD-10-CM | POA: Insufficient documentation

## 2022-09-16 DIAGNOSIS — O09523 Supervision of elderly multigravida, third trimester: Secondary | ICD-10-CM | POA: Diagnosis not present

## 2022-09-16 DIAGNOSIS — O24419 Gestational diabetes mellitus in pregnancy, unspecified control: Secondary | ICD-10-CM | POA: Diagnosis present

## 2022-09-16 DIAGNOSIS — Z3A33 33 weeks gestation of pregnancy: Secondary | ICD-10-CM

## 2022-09-16 DIAGNOSIS — O34219 Maternal care for unspecified type scar from previous cesarean delivery: Secondary | ICD-10-CM | POA: Insufficient documentation

## 2022-09-16 NOTE — Progress Notes (Signed)
Patient is 6 days out from + covid test. Sxs are subsiding. Patient and husb are wearing masks.

## 2022-09-17 ENCOUNTER — Encounter: Payer: Self-pay | Admitting: Obstetrics and Gynecology

## 2022-09-17 ENCOUNTER — Ambulatory Visit: Payer: Medicaid Other

## 2022-09-18 ENCOUNTER — Other Ambulatory Visit: Payer: Self-pay | Admitting: *Deleted

## 2022-09-18 ENCOUNTER — Ambulatory Visit (INDEPENDENT_AMBULATORY_CARE_PROVIDER_SITE_OTHER): Payer: Medicaid Other | Admitting: Registered"

## 2022-09-18 ENCOUNTER — Other Ambulatory Visit: Payer: Self-pay

## 2022-09-18 ENCOUNTER — Encounter: Payer: Medicaid Other | Attending: Family Medicine | Admitting: Registered"

## 2022-09-18 DIAGNOSIS — O24419 Gestational diabetes mellitus in pregnancy, unspecified control: Secondary | ICD-10-CM

## 2022-09-18 DIAGNOSIS — Z3A00 Weeks of gestation of pregnancy not specified: Secondary | ICD-10-CM | POA: Diagnosis not present

## 2022-09-18 MED ORDER — INSULIN GLARGINE 100 UNIT/ML SOLOSTAR PEN: 10.0000 [IU] | PEN_INJECTOR | Freq: Every day | SUBCUTANEOUS | 11 refills | Status: DC

## 2022-09-18 MED ORDER — INSULIN NPH (HUMAN) (ISOPHANE) 100 UNIT/ML ~~LOC~~ SUSP: 10.0000 [IU] | Freq: Every day | SUBCUTANEOUS | 3 refills | Status: DC

## 2022-09-18 MED ORDER — "INSULIN SYRINGE-NEEDLE U-100 30G X 15/64"" 1 ML MISC": 1.0000 | 3 refills | Status: DC

## 2022-09-18 NOTE — Progress Notes (Signed)
Insulin Instruction  Patient was seen on 09/18/2022 for insulin instruction.   Start: 1020 End: 49   MD orders are:   NPH 10 units qhs  The following learning objectives were met by the patient during this visit:   Insulin Action of NPH and R insulins  Reviewed syringe & vial including # units per syringe and vial  Hygiene and storage  Drawing up single dose using vials  Rotation of Sites  Hypoglycemia- symptoms, causes, treatment choices  Record keeping and MD follow up  Patient demonstrated understanding of insulin administration by return demonstration.  Patient received the following handouts: Insulin Instruction Handout                                        Patient to start on insulin as Rx'd by MD  Patient will be seen for follow-up in 1 weeks or as needed.

## 2022-09-21 ENCOUNTER — Encounter: Payer: Self-pay | Admitting: Family Medicine

## 2022-09-22 ENCOUNTER — Ambulatory Visit: Payer: Medicaid Other | Attending: Obstetrics

## 2022-09-22 ENCOUNTER — Encounter: Payer: Self-pay | Admitting: *Deleted

## 2022-09-22 ENCOUNTER — Ambulatory Visit: Payer: Medicaid Other | Admitting: *Deleted

## 2022-09-22 VITALS — BP 112/56 | HR 84

## 2022-09-22 DIAGNOSIS — O24419 Gestational diabetes mellitus in pregnancy, unspecified control: Secondary | ICD-10-CM

## 2022-09-22 DIAGNOSIS — O09523 Supervision of elderly multigravida, third trimester: Secondary | ICD-10-CM | POA: Insufficient documentation

## 2022-09-22 DIAGNOSIS — O24415 Gestational diabetes mellitus in pregnancy, controlled by oral hypoglycemic drugs: Secondary | ICD-10-CM | POA: Insufficient documentation

## 2022-09-22 DIAGNOSIS — Z98891 History of uterine scar from previous surgery: Secondary | ICD-10-CM | POA: Insufficient documentation

## 2022-09-22 DIAGNOSIS — O34219 Maternal care for unspecified type scar from previous cesarean delivery: Secondary | ICD-10-CM | POA: Insufficient documentation

## 2022-09-22 DIAGNOSIS — O24414 Gestational diabetes mellitus in pregnancy, insulin controlled: Secondary | ICD-10-CM | POA: Diagnosis not present

## 2022-09-22 DIAGNOSIS — Z3A34 34 weeks gestation of pregnancy: Secondary | ICD-10-CM

## 2022-09-23 ENCOUNTER — Ambulatory Visit: Payer: Medicaid Other | Attending: Family Medicine

## 2022-09-23 DIAGNOSIS — M5459 Other low back pain: Secondary | ICD-10-CM | POA: Insufficient documentation

## 2022-09-23 DIAGNOSIS — M6281 Muscle weakness (generalized): Secondary | ICD-10-CM | POA: Insufficient documentation

## 2022-09-23 DIAGNOSIS — R252 Cramp and spasm: Secondary | ICD-10-CM | POA: Diagnosis present

## 2022-09-23 DIAGNOSIS — R262 Difficulty in walking, not elsewhere classified: Secondary | ICD-10-CM | POA: Diagnosis present

## 2022-09-23 NOTE — Therapy (Signed)
OUTPATIENT PHYSICAL THERAPY TREATMENT   Patient Name: Susan Suarez MRN: [id number] DOB:[birth year], 36 y.o., female Today's Date: 09/23/2022   PT End of Session - 09/23/22 1542     Visit Number 6    Number of Visits 7    Date for PT Re-Evaluation 10/14/22    Authorization Type [place] Medicaid Healthy Blue    Authorization Time Period [employer] Approved 6 visits, 08/19/2022-10/17/2022, auth#[id number] (requested 4 more visits from 09/27/22 through 10/10/22 (Patient induction scheduled for 10/12/22.    Authorization - Visit Number 6    Authorization - Number of Visits 7    Progress Note Due on Visit 11    PT Start Time 5956    PT Stop Time 1615    PT Time Calculation (min) 45 min    Activity Tolerance Patient tolerated treatment well    Behavior During Therapy WFL for tasks assessed/performed              Past Medical History:  Diagnosis Date   Anxiety    Depression    Gestational diabetes    Past Surgical History:  Procedure Laterality Date   CESAREAN SECTION     WISDOM TOOTH EXTRACTION     Patient Active Problem List   Diagnosis Date Noted   GDM, class A2 08/14/2022   Supervision of normal pregnancy, antepartum 04/16/2022   AMA (advanced maternal age) multigravida 35+ 04/16/2022   History of actinic keratosis 04/16/2022   Previous Classical Cesarean Section 04/16/2022   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 10/30/2021   Rosacea 05/14/2021   Recurrent major depressive disorder, in full remission ([place]) 10/28/2017   Cervical neuropathy 04/07/2016   Generalized anxiety disorder 04/07/2016    PCP: Nickola Major, MD  REFERRING PROVIDER: Donnamae Jude, MD   REFERRING DIAG: [id number] (ICD-10-CM) - Bilateral sciatica   Rationale for Evaluation and Treatment Rehabilitation  THERAPY DIAG:  Other low back pain  Cramp and spasm  Difficulty in walking, not elsewhere classified  Muscle weakness  (generalized)  ONSET DATE: 07/22/22  SUBJECTIVE:                                                                                                                                                                                           SUBJECTIVE STATEMENT: I ordered a new belly band that has suspenders.  The SI pain is a lot better.  Right now it's the "crotch pain".     PERTINENT HISTORY:  MD notes: Susan Suarez is a 36 y.o. G4P1021 at 51w3dbeing seen today for ongoing prenatal care.  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   PAIN:  Are you having pain? Yes: NPRS scale: 0/10 Pain location: low back when walking or standing for long periods of time, worst is when I get up out of bed  Pain description: aching Aggravating factors: standing/walking Relieving factors: rest/meds   PRECAUTIONS: Other: pregnancy approx 30+ weeks  WEIGHT BEARING RESTRICTIONS No  FALLS:  Has patient fallen in last 6 months? No  LIVING ENVIRONMENT: Lives with: lives with their family and lives with their spouse Lives in: House/apartment  OCCUPATION: Desk job  PLOF: Independent  PATIENT GOALS to relieve pain and make it to C section date without having to be out of work or have to be bedridden  OBJECTIVE:   DIAGNOSTIC FINDINGS:  none  PATIENT SURVEYS:  Initial eval: Modified Oswestry 18 (moderate disability)  09/10/22:  Modified Oswestry 10(mild disability)   SCREENING FOR RED FLAGS: Bowel or bladder incontinence: No Spinal tumors: No Cauda equina syndrome:  No Compression fracture: No Abdominal aneurysm: No  COGNITION:  Overall cognitive status: Within functional limits for tasks assessed     SENSATION: WFL  MUSCLE LENGTH: Hamstrings: Right 75 deg; Left 75 deg Thomas test: Right pos ; Left pos   POSTURE: increased lumbar lordosis   LUMBAR ROM:   Active  A/PROM  eval  Flexion Deferred due to pregnancy  Extension   Right lateral flexion   Left lateral flexion   Right rotation   Left rotation    (Blank rows = not tested)  LOWER EXTREMITY ROM:    All WFL  LOWER EXTREMITY MMT:   All generally 4+/5  LUMBAR SPECIAL TESTS:  Straight leg raise test: Negative and FABER test: Positive  FUNCTIONAL TESTS:  Initial eval: 5 times sit to stand: 8.24 sec Timed up and go (TUG): 9.30   09/10/22:   5 times sit to stand: 7.77 sec Timed up and go (TUG): 6.00 GAIT: Distance walked: 50 Assistive device utilized: None Level of assistance: Complete Independence Comments: slow/guarded  TODAY'S TREATMENT 09/23/22 NuStep x 5 min level 1 Supine hamstring stretch x 3 each LE  Isometric hip abduction x 10 hold 10 sec Isometric hip adduction x 10 hold 10 sec Supine lower trunk rotation x 20 Quadruped cat/cow x 10 Cat with childs pose with hips abducted x 3 hold 30 sec (Patient moving very slow today and with pubic symphysis pain)  TODAY'S TREATMENT 09/10/22 Runners stretch position with one leg off edge of bed: hamstring and adductor stretch x 3 holding 30 sec Quadruped cat/cow x 10 Cat with childs pose with hips abducted x 3 hold 30 sec Supine pelvic tilt x 20 Supine lower trunk rotation x 20 Re-assessment for additional visits request  TODAY'S TREATMENT 09/08/22 Nustep x 6 min level 2  PT present to discuss progress Standing palloff press x 10 each side with red band Supine pelvic tilt x 20 Supine lower trunk rotation x 20 Combined hip IR/ER stretch in hooklying x 5 each side  Isometric hip adduction x 20 Isometric hip abduction  x 20 ITB/lateral hip stretch 3 x 30 sec each LE  Manual: K tape for off loading belly with Earlie Counts, PT  PATIENT EDUCATION: any treatment administered on first visit was not billed due to insurance restrictions Education details: Initiated HEP Person educated: Patient Education method: Consulting civil engineer, Media planner, Verbal cues, and Handouts Education comprehension: verbalized understanding, returned demonstration, and verbal cues required   HOME EXERCISE PROGRAM:  Access Code: [email or web address] URL: [email or web address] Date: 08/19/2022 Prepared by: Candyce Churn  Exercises - Supine Hamstring Stretch with Strap  - 1 x daily - 7 x weekly - 1 sets - 5 reps - 10 hold - Supine Butterfly Groin Stretch  - 1 x daily - 7 x weekly - 1 sets - 5 reps - 10 hold - Standing Hip Flexor Stretch with Foot Elevated  - 1 x daily - 7 x weekly - 1 sets - 5 reps - 10 hold - Supine Hip Internal and External Rotation  - 1 x daily - 7 x weekly - 1 sets - 5 reps - 10 hold  ASSESSMENT:  CLINICAL IMPRESSION: Coren is getting good relief of the S.I. pain.  Her primary pain in now in the pubic symphysis.  She did say that the baby is in breech position.    She should continue to respond well to skilled PT for LE flexibility, lumbar ROM acitivities,  instruction in use of belly band and modalities for pain relief to allow her to make it to her due date/scheduled C section without having to be placed on bed rest.     OBJECTIVE IMPAIRMENTS difficulty walking, decreased ROM, decreased strength, increased fascial restrictions, increased muscle spasms, impaired flexibility, and pain.   ACTIVITY LIMITATIONS carrying, lifting, bending, sitting, standing, squatting, sleeping, stairs, transfers, and dressing  PARTICIPATION LIMITATIONS: meal prep, cleaning, laundry, driving, shopping, community activity, occupation, and yard work  PERSONAL FACTORS 1 comorbidity: pregnancy  are also affecting patient's  functional outcome.   REHAB POTENTIAL: Good  CLINICAL DECISION MAKING: Stable/uncomplicated  EVALUATION COMPLEXITY: Low   GOALS: Goals reviewed with patient? Yes  SHORT TERM GOALS: Target date: 09/16/2022  Patient will be independent with initial HEP  Baseline: Goal status: MET  2.  Pain report to be no greater than 4/10  Baseline: 6/10 max  Goal status: in progress   LONG TERM GOALS: Target date: 10/14/2022  Patient to be independent with advanced HEP  Baseline:  Goal status: MET  2.  Patient to report pain no greater than 2/10  Baseline:  Goal status: IN PROGRESS  3.  Oswestry score to improve by 10 points Baseline:  Goal status: IN PROGRESS  4.  Patient to be able to transfer in/out bed without pain Baseline: 6/10 with good technique Goal status: In progress   5.  Patient to report 50% improvement pre-partem  Baseline: 25% (09/08/22) Goal status: MET  6.  Patient to report 85% improvement post partem Baseline:  Goal status: IN PROGRESS   PLAN: PT FREQUENCY: 1-2x/week  PT DURATION: 8 weeks  PLANNED INTERVENTIONS: Therapeutic exercises, Therapeutic activity, Neuromuscular re-education, Balance training, Gait training, Patient/Family education, Self Care, Joint mobilization, Stair training, Aquatic Therapy, Dry Needling, Spinal mobilization, Cryotherapy, Moist heat, Taping, Manual therapy, and Re-evaluation.  PLAN FOR NEXT SESSION:  NuStep, modified core strengthening, MET's, taping for off loading belly.   Anderson Malta B. Dalary Hollar, PT 09/23/22 5:37 PM  [facility] [address], [place], [place] [address] Phone # [phone] Fax [phone]

## 2022-09-24 ENCOUNTER — Ambulatory Visit (INDEPENDENT_AMBULATORY_CARE_PROVIDER_SITE_OTHER): Payer: Medicaid Other | Admitting: Family Medicine

## 2022-09-24 VITALS — BP 110/70 | HR 90 | Wt 159.0 lb

## 2022-09-24 DIAGNOSIS — O09523 Supervision of elderly multigravida, third trimester: Secondary | ICD-10-CM

## 2022-09-24 DIAGNOSIS — O24419 Gestational diabetes mellitus in pregnancy, unspecified control: Secondary | ICD-10-CM

## 2022-09-24 DIAGNOSIS — Z3480 Encounter for supervision of other normal pregnancy, unspecified trimester: Secondary | ICD-10-CM

## 2022-09-24 DIAGNOSIS — Z98891 History of uterine scar from previous surgery: Secondary | ICD-10-CM

## 2022-09-24 NOTE — Progress Notes (Signed)
   PRENATAL VISIT NOTE  Subjective:  Susan Suarez is a 36 y.o. T7D2202 at 40w3dbeing seen today for ongoing prenatal care.  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  Patient reports no complaints.  Contractions: Not present. Vag. Bleeding: None.  Movement: Present. Denies leaking of fluid.   The following portions of the patient's history were reviewed and updated as appropriate: allergies, current medications, past family history, past medical history, past social history, past surgical history and problem list.   Objective:   Vitals:   09/24/22 1344  BP: 110/70  Pulse: 90  Weight: 159 lb (72.1 kg)    Fetal Status: Fetal Heart Rate (bpm): 152   Movement: Present     General:  Alert, oriented and cooperative. Patient is in no acute distress.  Skin: Skin is warm and dry. No rash noted.   Cardiovascular: Normal heart rate noted  Respiratory: Normal respiratory effort, no problems with respiration noted  Abdomen: Soft, gravid, appropriate for gestational age.  Pain/Pressure: Present     Pelvic: Cervical exam deferred        Extremities: Normal range of motion.  Edema: Mild pitting, slight indentation  Mental Status: Normal mood and affect. Normal behavior. Normal judgment and thought content.   Assessment and Plan:  Pregnancy: G4P1021 at 361w3d. Supervision of other normal pregnancy, antepartum Continue routine prenatal care.  2. GDM, class A2 We have been increasing her pm NPH, up to 14 and today FBS was 98. Continue to monitor. In  antenatal testing.  3. Multigravida of advanced maternal age in third trimester  LR NIPT  4. Previous Classical Cesarean Section For RCS at 37 weeks  Preterm labor symptoms and general obstetric precautions including but not limited to vaginal bleeding, contractions, leaking of fluid and fetal movement were reviewed in detail with the patient. Please refer to After Visit Summary for other counseling recommendations.   Return in 2 weeks (on 10/08/2022).  Future Appointments  Date Time Provider [place]10/04/2022  2:00 PM FiCandyce Churn, PT [facility] None  09/29/2022  3:30 PM TaDanie BinderPT [facility] None  09/30/2022  7:15 AM [facility] NURSE [facility]/09/2022  7:30 AM [facility] US2 [facility]/10/2022  3:30 PM FiCandyce Churn, PT [facility] None  10/06/2022  7:15 AM [facility] NURSE [facility]/16/2023  7:30 AM [facility] US[facility]10/17/2023  3:30 PM FiCandyce Churn, PT [facility] None  10/08/2022  1:30 PM PrDonnamae JudeMD [facility]  10/09/2022  3:30 PM FiIsabel CapricePT [facility] None  10/14/2022  7:15 AM [facility] NURSE [facility]10/24/2023  7:30 AM [facility] US2 [facility]10/25/2023  1:30 PM PrDonnamae JudeMD [facility]  10/22/2022  1:30 PM PrDonnamae JudeMD [facility]  11/05/2022  1:30 PM PrDonnamae JudeMD [facility]    TaDonnamae JudeMD

## 2022-09-24 NOTE — Progress Notes (Signed)
ROB [redacted]w[redacted]d Pt brought blood sugar log to today's visit.   CC: None

## 2022-09-25 ENCOUNTER — Ambulatory Visit: Payer: Medicaid Other

## 2022-09-25 DIAGNOSIS — M6281 Muscle weakness (generalized): Secondary | ICD-10-CM

## 2022-09-25 DIAGNOSIS — R252 Cramp and spasm: Secondary | ICD-10-CM

## 2022-09-25 DIAGNOSIS — R262 Difficulty in walking, not elsewhere classified: Secondary | ICD-10-CM

## 2022-09-25 DIAGNOSIS — M5459 Other low back pain: Secondary | ICD-10-CM | POA: Diagnosis not present

## 2022-09-25 NOTE — Therapy (Signed)
OUTPATIENT PHYSICAL THERAPY TREATMENT   Patient Name: Susan Suarez MRN: [id number] DOB:[birth year], 36 y.o., female Today's Date: 09/25/2022   PT End of Session - 09/25/22 1411     Visit Number 7    Number of Visits 11    Date for PT Re-Evaluation 10/14/22    Authorization Type [place] Medicaid Healthy Blue    Authorization Time Period [employer] Approved 6 visits, 08/19/2022-10/17/2022, auth#[id number] Approved 4 more visits from 09/27/22 through 10/10/22 (Patient induction scheduled for 10/12/22)    Authorization - Visit Number 7    Authorization - Number of Visits 11    Progress Note Due on Visit 11    PT Start Time 8676    PT Stop Time 1445    PT Time Calculation (min) 40 min    Activity Tolerance Patient tolerated treatment well    Behavior During Therapy [facility] for tasks assessed/performed              Past Medical History:  Diagnosis Date   Anxiety    Depression    Gestational diabetes    Past Surgical History:  Procedure Laterality Date   CESAREAN SECTION     WISDOM TOOTH EXTRACTION     Patient Active Problem List   Diagnosis Date Noted   GDM, class A2 08/14/2022   Supervision of normal pregnancy, antepartum 04/16/2022   AMA (advanced maternal age) multigravida 35+ 04/16/2022   History of actinic keratosis 04/16/2022   Previous Classical Cesarean Section 04/16/2022   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 10/30/2021   Rosacea 05/14/2021   Recurrent major depressive disorder, in full remission ([place]) 10/28/2017   Cervical neuropathy 04/07/2016   Generalized anxiety disorder 04/07/2016    PCP: Nickola Major, MD  REFERRING PROVIDER: Donnamae Jude, MD   REFERRING DIAG: [phone] (ICD-10-CM) - Bilateral sciatica   Rationale for Evaluation and Treatment Rehabilitation  THERAPY DIAG:  Other low back pain  Cramp and spasm  Difficulty in walking, not elsewhere classified  Muscle weakness  (generalized)  ONSET DATE: 07/22/22  SUBJECTIVE:                                                                                                                                                                                           SUBJECTIVE STATEMENT: Patient states S.I. pain is still minimal but the pubic symphysis pain is about the same.     PERTINENT HISTORY:  MD notes: Didi Ganaway is a 36 y.o. G4P1021 at 65w3dbeing seen today for ongoing prenatal care.  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   PAIN:  Are you having pain? Yes: NPRS scale: 0/10 Pain location: low back when walking or standing for long periods of time, worst is when I get up out of bed  Pain description: aching Aggravating factors: standing/walking Relieving factors: rest/meds   PRECAUTIONS: Other: pregnancy approx 30+ weeks  WEIGHT BEARING RESTRICTIONS No  FALLS:  Has patient fallen in last 6 months? No  LIVING ENVIRONMENT: Lives with: lives with their family and lives with their spouse Lives in: House/apartment  OCCUPATION: Desk job  PLOF: Independent  PATIENT GOALS to relieve pain and make it to C section date without having to be out of work or have to be bedridden  OBJECTIVE:   DIAGNOSTIC FINDINGS:  none  PATIENT SURVEYS:  Initial eval: Modified Oswestry 18 (moderate disability)  09/10/22:  Modified Oswestry 10(mild disability)   SCREENING FOR RED FLAGS: Bowel or bladder incontinence: No Spinal tumors: No Cauda equina syndrome: No Compression fracture:  No Abdominal aneurysm: No  COGNITION:  Overall cognitive status: Within functional limits for tasks assessed     SENSATION: WFL  MUSCLE LENGTH: Hamstrings: Right 75 deg; Left 75 deg Thomas test: Right pos ; Left pos   POSTURE: increased lumbar lordosis   LUMBAR ROM:   Active  A/PROM  eval  Flexion Deferred due to pregnancy  Extension   Right lateral flexion   Left lateral flexion   Right rotation   Left rotation    (Blank rows = not tested)  LOWER EXTREMITY ROM:    All WFL  LOWER EXTREMITY MMT:   All generally 4+/5  LUMBAR SPECIAL TESTS:  Straight leg raise test: Negative and FABER test: Positive  FUNCTIONAL TESTS:  Initial eval: 5 times sit to stand: 8.24 sec Timed up and go (TUG): 9.30   09/10/22:   5 times sit to stand: 7.77 sec Timed up and go (TUG): 6.00 GAIT: Distance walked: 50 Assistive device utilized: None Level of assistance: Complete Independence Comments: slow/guarded  TODAY'S TREATMENT 09/25/22 NuStep x 5 min level 1 Pelvic tilt x 20 Supine hamstring stretch x 3 each LE  Isometric hip abduction x 10 hold 10 sec Isometric hip adduction x 10 hold 10 sec Hook lying clam x 20 Supine lower trunk rotation x 20 Quadruped cat/cow x 10 Cat with childs pose with hips abducted x 3 hold 30 sec   TODAY'S TREATMENT 09/23/22 NuStep x 5 min level 1 Pelvic tilt x 20 Supine hamstring stretch x 3 each LE  Isometric hip abduction x 10 hold 10 sec Isometric hip adduction x 10 hold 10 sec Supine lower trunk rotation x 20 Quadruped cat/cow x 10 Cat with childs pose with hips abducted x 3 hold 30 sec   TODAY'S TREATMENT 09/10/22 Runners stretch position with one leg off edge of bed: hamstring and adductor stretch x 3 holding 30 sec Quadruped cat/cow x 10 Cat with childs pose with hips abducted x 3 hold 30 sec Supine pelvic tilt x 20 Supine lower trunk rotation x 20 Re-assessment for additional visits request  PATIENT EDUCATION: any treatment  administered on first visit was not billed due to insurance restrictions Education details: Initiated HEP Person educated: Patient Education method: Consulting civil engineer, Media planner,  Verbal cues, and Handouts Education comprehension: verbalized understanding, returned demonstration, and verbal cues required   HOME EXERCISE PROGRAM:  Access Code: [email or web address] URL: [email or web address] Date: 08/19/2022 Prepared by: Candyce Churn  Exercises - Supine Hamstring Stretch with Strap  - 1 x daily - 7 x weekly - 1 sets - 5 reps - 10 hold - Supine Butterfly Groin Stretch  - 1 x daily - 7 x weekly - 1 sets - 5 reps - 10 hold - Standing Hip Flexor Stretch with Foot Elevated  - 1 x daily - 7 x weekly - 1 sets - 5 reps - 10 hold - Supine Hip Internal and External Rotation  - 1 x daily - 7 x weekly - 1 sets - 5 reps - 10 hold  ASSESSMENT:  CLINICAL IMPRESSION: Mylinh continues to have good relief of the S.I. pain.  Her primary pain remains in the pubic symphysis.  The new belly band doesn't seem to be tight enough for the suspenders to be of assistance to offloading the pelvis.   She should continue to respond well to skilled PT for LE flexibility, lumbar ROM acitivities,  instruction in use of belly band and modalities for pain relief to allow her to make it to her due date/scheduled C section without having to be placed on bed rest.     OBJECTIVE IMPAIRMENTS difficulty walking, decreased ROM, decreased strength, increased fascial restrictions, increased muscle spasms, impaired flexibility, and pain.   ACTIVITY LIMITATIONS carrying, lifting, bending, sitting, standing, squatting, sleeping, stairs, transfers, and dressing  PARTICIPATION LIMITATIONS: meal prep, cleaning, laundry, driving, shopping, community activity, occupation, and yard work  PERSONAL FACTORS 1 comorbidity: pregnancy  are also affecting patient's functional outcome.   REHAB POTENTIAL: Good  CLINICAL DECISION MAKING:  Stable/uncomplicated  EVALUATION COMPLEXITY: Low   GOALS: Goals reviewed with patient? Yes  SHORT TERM GOALS: Target date: 09/16/2022  Patient will be independent with initial HEP  Baseline: Goal status: MET  2.  Pain report to be no greater than 4/10  Baseline: 6/10 max  Goal status: in progress   LONG TERM GOALS: Target date: 10/14/2022  Patient to be independent with advanced HEP  Baseline:  Goal status: MET  2.  Patient to report pain no greater than 2/10  Baseline:  Goal status: IN PROGRESS  3.  Oswestry score to improve by 10 points Baseline:  Goal status: IN PROGRESS  4.  Patient to be able to transfer in/out bed without pain Baseline: 6/10 with good technique Goal status: In progress   5.  Patient to report 50% improvement pre-partem  Baseline: 25% (09/08/22) Goal status: MET  6.  Patient to report 85% improvement post partem Baseline:  Goal status: IN PROGRESS   PLAN: PT FREQUENCY: 1-2x/week  PT DURATION: 8 weeks  PLANNED INTERVENTIONS: Therapeutic exercises, Therapeutic activity, Neuromuscular re-education, Balance training, Gait training, Patient/Family education, Self Care, Joint mobilization, Stair training, Aquatic Therapy, Dry Needling, Spinal mobilization, Cryotherapy, Moist heat, Taping, Manual therapy, and Re-evaluation.  PLAN FOR NEXT SESSION:  NuStep, modified core strengthening, MET's, taping for off loading belly.   Anderson Malta B. Alejandra Hunt, PT 09/25/22 10:20 PM  [facility] [address] [place], [place] [address] Phone # [phone] Fax [phone]

## 2022-09-29 ENCOUNTER — Ambulatory Visit: Payer: Medicaid Other

## 2022-09-29 DIAGNOSIS — R262 Difficulty in walking, not elsewhere classified: Secondary | ICD-10-CM

## 2022-09-29 DIAGNOSIS — M6281 Muscle weakness (generalized): Secondary | ICD-10-CM

## 2022-09-29 DIAGNOSIS — M5459 Other low back pain: Secondary | ICD-10-CM | POA: Diagnosis not present

## 2022-09-29 DIAGNOSIS — R252 Cramp and spasm: Secondary | ICD-10-CM

## 2022-09-29 NOTE — Therapy (Signed)
OUTPATIENT PHYSICAL THERAPY TREATMENT   Patient Name: Susan Suarez MRN: [id number] DOB:[birth year], 36 y.o., female Today's Date: 09/29/2022   PT End of Session - 09/29/22 1608     Visit Number 8    Date for PT Re-Evaluation 10/14/22    Authorization Type [place] Medicaid Healthy Blue    Authorization Time Period [employer] Approved 6 visits, 08/19/2022-10/17/2022, auth#[id number] Approved 4 more visits from 09/27/22 through 10/10/22 (Patient induction scheduled for 10/12/22)    Authorization - Visit Number 8    Authorization - Number of Visits 11    PT Start Time 1534    PT Stop Time 1609    PT Time Calculation (min) 35 min    Activity Tolerance Patient tolerated treatment well    Behavior During Therapy WFL for tasks assessed/performed               Past Medical History:  Diagnosis Date   Anxiety    Depression    Gestational diabetes    Past Surgical History:  Procedure Laterality Date   [place] EXTRACTION     Patient Active Problem List   Diagnosis Date Noted   GDM, class A2 08/14/2022   Supervision of normal pregnancy, antepartum 04/16/2022   AMA (advanced maternal age) multigravida 35+ 04/16/2022   History of actinic keratosis 04/16/2022   Previous Classical Cesarean Section 04/16/2022   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 10/30/2021   Rosacea 05/14/2021   Recurrent major depressive disorder, in full remission ([place]) 10/28/2017   Cervical neuropathy 04/07/2016   Generalized anxiety disorder 04/07/2016    PCP: Nickola Major, MD  REFERRING PROVIDER: Donnamae Jude, MD   REFERRING DIAG: [phone] (ICD-10-CM) - Bilateral sciatica   Rationale for Evaluation and Treatment Rehabilitation  THERAPY DIAG:  Other low back pain  Cramp and spasm  Difficulty in walking, not elsewhere classified  Muscle weakness (generalized)  ONSET DATE: 07/22/22  SUBJECTIVE:                                                                                                                                                                                            SUBJECTIVE STATEMENT: S.I. pain is still minimal but the pubic symphysis pain is about the same.  I will have c-section in 2 weeks.  I got another bell belly band and it is supposed to lift more.   PERTINENT HISTORY:  MD notes: Arlyss Weathersby is a 36 y.o. G4P1021 at 38w3dbeing seen today for ongoing prenatal care.  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   PAIN:  Are you having pain? Yes: NPRS scale: 0/10 Pain location: low back when walking or standing for long periods of time, worst is when I get up out of bed  Pain description: aching Aggravating factors: standing/walking Relieving factors: rest/meds   PRECAUTIONS: Other: pregnancy approx 30+ weeks  WEIGHT BEARING RESTRICTIONS No  FALLS:  Has patient fallen in last 6 months? No  LIVING ENVIRONMENT: Lives with: lives with their family and lives with their spouse Lives in: House/apartment  OCCUPATION: Desk job  PLOF: Independent  PATIENT GOALS to relieve pain and make it to C section date without having to be out of work or have to be bedridden  OBJECTIVE:   DIAGNOSTIC FINDINGS:  none  PATIENT SURVEYS:  Initial eval: Modified Oswestry 18 (moderate disability)  09/10/22:  Modified Oswestry 10(mild disability)   SCREENING FOR RED FLAGS: Bowel or bladder incontinence: No Spinal tumors: No Cauda equina syndrome: No Compression  fracture: No Abdominal aneurysm: No  COGNITION:  Overall cognitive status: Within functional limits for tasks assessed     SENSATION: WFL  MUSCLE LENGTH: Hamstrings: Right 75 deg; Left 75 deg Thomas test: Right pos ; Left pos   POSTURE: increased lumbar lordosis   LUMBAR ROM:   Active  A/PROM  eval  Flexion Deferred due to pregnancy  Extension   Right lateral flexion   Left lateral flexion   Right rotation   Left rotation    (Blank rows = not tested)  LOWER EXTREMITY ROM:    All WFL  LOWER EXTREMITY MMT:   All generally 4+/5  LUMBAR SPECIAL TESTS:  Straight leg raise test: Negative and FABER test: Positive  FUNCTIONAL TESTS:  Initial eval: 5 times sit to stand: 8.24 sec Timed up and go (TUG): 9.30   09/10/22:   5 times sit to stand: 7.77 sec Timed up and go (TUG): 6.00 GAIT: Distance walked: 50 Assistive device utilized: None Level of assistance: Complete Independence Comments: slow/guarded  TODAY'S TREATMENT 09/29/22 NuStep x 5 min level 1 Pelvic tilt x 20 Supine hamstring stretch x 3 each LE  Isometric hip abduction x 10 hold 10 sec Isometric hip adduction x 10 hold 10 sec Hook lying clam x 20 Supine lower trunk rotation x 20 Quadruped cat/cow x 10 Cat with childs pose with hips abducted x 3 hold 30 sec  TODAY'S TREATMENT 09/25/22 NuStep x 5 min level 1 Pelvic tilt x 20 Supine hamstring stretch x 3 each LE  Isometric hip abduction x 10 hold 10 sec Isometric hip adduction x 10 hold 10 sec Hook lying clam x 20 Supine lower trunk rotation x 20 Quadruped cat/cow x 10 Cat with childs pose with hips abducted x 3 hold 30 sec   TODAY'S TREATMENT 09/23/22 NuStep x 5 min level 1 Pelvic tilt x 20 Supine hamstring stretch x 3 each LE  Isometric hip abduction x 10 hold 10 sec Isometric hip adduction x 10 hold 10 sec Supine lower trunk rotation x 20 Quadruped cat/cow x 10 Cat with childs pose with hips abducted x 3 hold 30 sec    PATIENT EDUCATION:  any treatment administered on first visit was not billed due  to insurance restrictions Education details: Initiated HEP Person educated: Patient Education method: Consulting civil engineer, Media planner, Verbal cues, and Handouts Education comprehension: verbalized understanding, returned demonstration, and verbal cues required   HOME EXERCISE PROGRAM:  Access Code: [email or web address] URL: [email or web address] Date: 08/19/2022 Prepared by: Candyce Churn  Exercises - Supine Hamstring Stretch with Strap  - 1 x daily - 7 x weekly - 1 sets - 5 reps - 10 hold - Supine Butterfly Groin Stretch  - 1 x daily - 7 x weekly - 1 sets - 5 reps - 10 hold - Standing Hip Flexor Stretch with Foot Elevated  - 1 x daily - 7 x weekly - 1 sets - 5 reps - 10 hold - Supine Hip Internal and External Rotation  - 1 x daily - 7 x weekly - 1 sets - 5 reps - 10 hold  ASSESSMENT:  CLINICAL IMPRESSION: Pt is is wearing her belly band to offload her pelvis and reports reduced SI joint pain.  Pt does well with all HEP and exercises in the clinic.   She should continue to respond well to skilled PT for LE flexibility, lumbar ROM acitivities,  instruction in use of belly band and modalities for pain relief to allow her to make it to her due date/scheduled C section without having to be placed on bed rest.  1 more session probable.    OBJECTIVE IMPAIRMENTS difficulty walking, decreased ROM, decreased strength, increased fascial restrictions, increased muscle spasms, impaired flexibility, and pain.   ACTIVITY LIMITATIONS carrying, lifting, bending, sitting, standing, squatting, sleeping, stairs, transfers, and dressing  PARTICIPATION LIMITATIONS: meal prep, cleaning, laundry, driving, shopping, community activity, occupation, and yard work  PERSONAL FACTORS 1 comorbidity: pregnancy  are also affecting patient's functional outcome.   REHAB POTENTIAL: Good  CLINICAL DECISION MAKING: Stable/uncomplicated  EVALUATION  COMPLEXITY: Low   GOALS: Goals reviewed with patient? Yes  SHORT TERM GOALS: Target date: 09/16/2022  Patient will be independent with initial HEP  Baseline: Goal status: MET  2.  Pain report to be no greater than 4/10  Baseline: 6/10 max  Goal status: in progress   LONG TERM GOALS: Target date: 10/14/2022  Patient to be independent with advanced HEP  Baseline:  Goal status: MET  2.  Patient to report pain no greater than 2/10  Baseline:  Goal status: IN PROGRESS  3.  Oswestry score to improve by 10 points Baseline:  Goal status: IN PROGRESS  4.  Patient to be able to transfer in/out bed without pain Baseline: 6/10 with good technique Goal status: In progress   5.  Patient to report 50% improvement pre-partem  Baseline: 25% (09/08/22) Goal status: MET  6.  Patient to report 85% improvement post partem Baseline:  Goal status: IN PROGRESS   PLAN: PT FREQUENCY: 1-2x/week  PT DURATION: 8 weeks  PLANNED INTERVENTIONS: Therapeutic exercises, Therapeutic activity, Neuromuscular re-education, Balance training, Gait training, Patient/Family education, Self Care, Joint mobilization, Stair training, Aquatic Therapy, Dry Needling, Spinal mobilization, Cryotherapy, Moist heat, Taping, Manual therapy, and Re-evaluation.  PLAN FOR NEXT SESSION:  NuStep, modified core strengthening,1 move visit probable   Sigurd Sos, PT 09/29/22 4:10 PM  [facility] [address] [place], [place] [address] Phone # [phone] Fax [phone]

## 2022-09-30 ENCOUNTER — Ambulatory Visit: Payer: Medicaid Other | Admitting: *Deleted

## 2022-09-30 ENCOUNTER — Ambulatory Visit: Payer: Medicaid Other | Attending: Obstetrics

## 2022-09-30 VITALS — BP 114/69 | HR 92

## 2022-09-30 DIAGNOSIS — O24414 Gestational diabetes mellitus in pregnancy, insulin controlled: Secondary | ICD-10-CM

## 2022-09-30 DIAGNOSIS — Z3480 Encounter for supervision of other normal pregnancy, unspecified trimester: Secondary | ICD-10-CM

## 2022-09-30 DIAGNOSIS — Z98891 History of uterine scar from previous surgery: Secondary | ICD-10-CM | POA: Diagnosis present

## 2022-09-30 DIAGNOSIS — O09523 Supervision of elderly multigravida, third trimester: Secondary | ICD-10-CM | POA: Diagnosis not present

## 2022-09-30 DIAGNOSIS — O24419 Gestational diabetes mellitus in pregnancy, unspecified control: Secondary | ICD-10-CM | POA: Diagnosis not present

## 2022-09-30 DIAGNOSIS — O34212 Maternal care for vertical scar from previous cesarean delivery: Secondary | ICD-10-CM | POA: Diagnosis not present

## 2022-09-30 DIAGNOSIS — O34219 Maternal care for unspecified type scar from previous cesarean delivery: Secondary | ICD-10-CM | POA: Diagnosis not present

## 2022-09-30 DIAGNOSIS — O24415 Gestational diabetes mellitus in pregnancy, controlled by oral hypoglycemic drugs: Secondary | ICD-10-CM | POA: Insufficient documentation

## 2022-09-30 DIAGNOSIS — Z3A35 35 weeks gestation of pregnancy: Secondary | ICD-10-CM | POA: Insufficient documentation

## 2022-09-30 DIAGNOSIS — Z369 Encounter for antenatal screening, unspecified: Secondary | ICD-10-CM | POA: Diagnosis not present

## 2022-09-30 NOTE — Patient Instructions (Addendum)
Abygail Galeno  09/30/2022   Your procedure is scheduled on:  10/12/2022  Arrive at [facility] at Entrance C on [address] at [facility]  and [address]. You are invited to use the FREE valet parking or use the Visitor's parking deck.  Pick up the phone at the desk and dial [phone].  Call this number if you have problems the morning of surgery: [phone]  Remember:   Do not eat food:(After Midnight) Desps de medianoche.  Do not drink clear liquids: (After Midnight) Desps de medianoche.  Take these medicines the morning of surgery with A SIP OF WATER:    Take half of the prescribed bedtime insulin the night before surgery.  No insulin on the day of surgery   Do not wear jewelry, make-up or nail polish.  Do not wear lotions, powders, or perfumes. Do not wear deodorant.  Do not shave 48 hours prior to surgery.  Do not bring valuables to the hospital.  [facility] is not   responsible for any belongings or valuables brought to the hospital.  Contacts, dentures or bridgework may not be worn into surgery.  Leave suitcase in the car. After surgery it may be brought to your room.  For patients admitted to the hospital, checkout time is 11:00 AM the day of              discharge.      Please read over the following fact sheets that you were given:     Preparing for Surgery

## 2022-10-01 ENCOUNTER — Telehealth (HOSPITAL_COMMUNITY): Payer: Self-pay | Admitting: *Deleted

## 2022-10-01 ENCOUNTER — Encounter (HOSPITAL_COMMUNITY): Payer: Self-pay

## 2022-10-01 ENCOUNTER — Ambulatory Visit: Payer: Medicaid Other

## 2022-10-01 DIAGNOSIS — M6281 Muscle weakness (generalized): Secondary | ICD-10-CM

## 2022-10-01 DIAGNOSIS — R262 Difficulty in walking, not elsewhere classified: Secondary | ICD-10-CM

## 2022-10-01 DIAGNOSIS — R252 Cramp and spasm: Secondary | ICD-10-CM

## 2022-10-01 DIAGNOSIS — M5459 Other low back pain: Secondary | ICD-10-CM

## 2022-10-01 NOTE — Telephone Encounter (Signed)
Preadmission screen  

## 2022-10-01 NOTE — Therapy (Signed)
OUTPATIENT PHYSICAL THERAPY TREATMENT   Patient Name: Susan Suarez MRN: [id number] DOB:11/23/1986, 36 y.o., female Today's Date: 10/01/2022   PT End of Session - 10/01/22 1536     Visit Number 9    Number of Visits 11    Date for PT Re-Evaluation 10/14/22    Authorization Type [place] Medicaid Healthy Blue    Authorization Time Period [employer] Approved 6 visits, 08/19/2022-10/17/2022, auth#[id number] Approved 4 more visits from 09/27/22 through 10/10/22 (Patient induction scheduled for 10/12/22)    Authorization - Visit Number 9    Authorization - Number of Visits 11    Progress Note Due on Visit 11    PT Start Time 4098    PT Stop Time 1614    PT Time Calculation (min) 44 min    Activity Tolerance Patient tolerated treatment well    Behavior During Therapy WFL for tasks assessed/performed               Past Medical History:  Diagnosis Date   Anxiety    Depression    Gestational diabetes    Past Surgical History:  Procedure Laterality Date   CESAREAN SECTION     WISDOM TOOTH EXTRACTION     Patient Active Problem List   Diagnosis Date Noted   GDM, class A2 08/14/2022   Supervision of normal pregnancy, antepartum 04/16/2022   AMA (advanced maternal age) multigravida 35+ 04/16/2022   History of actinic keratosis 04/16/2022   Previous Classical Cesarean Section 04/16/2022   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 10/30/2021   Rosacea 05/14/2021   Recurrent major depressive disorder, in full remission ([place]) 10/28/2017   Cervical neuropathy 04/07/2016   Generalized anxiety disorder 04/07/2016    PCP: Nickola Major, MD  REFERRING PROVIDER: Donnamae Jude, MD   REFERRING DIAG: [phone] (ICD-10-CM) - Bilateral sciatica   Rationale for Evaluation and Treatment Rehabilitation  THERAPY DIAG:  Other low back pain  Cramp and spasm  Difficulty in walking, not elsewhere classified  Muscle weakness  (generalized)  ONSET DATE: 07/22/22  SUBJECTIVE:                                                                                                                                                                                           SUBJECTIVE STATEMENT: Patient reports she is doing ok.  She is still having some pubic symphysis pain but it has been manageable with the exercises.     PERTINENT HISTORY:  MD notes: Susan Suarez is a 36 y.o. G4P1021 at 44w3dbeing seen today for ongoing prenatal care.  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   PAIN:  Are you having pain? Yes: NPRS scale: 0/10 Pain location: low back when walking or standing for long periods of time, worst is when I get up out of bed  Pain description: aching Aggravating factors: standing/walking Relieving factors: rest/meds   PRECAUTIONS: Other: pregnancy approx 30+ weeks  WEIGHT BEARING RESTRICTIONS No  FALLS:  Has patient fallen in last 6 months? No  LIVING ENVIRONMENT: Lives with: lives with their family and lives with their spouse Lives in: House/apartment  OCCUPATION: Desk job  PLOF: Independent  PATIENT GOALS to relieve pain and make it to C section date without having to be out of work or have to be bedridden  OBJECTIVE:   DIAGNOSTIC FINDINGS:  none  PATIENT SURVEYS:  Initial eval: Modified Oswestry 18 (moderate disability)  09/10/22:  Modified Oswestry 10(mild disability)   SCREENING FOR RED FLAGS: Bowel or bladder incontinence: No Spinal tumors: No Cauda equina  syndrome: No Compression fracture: No Abdominal aneurysm: No  COGNITION:  Overall cognitive status: Within functional limits for tasks assessed     SENSATION: WFL  MUSCLE LENGTH: Hamstrings: Right 75 deg; Left 75 deg Thomas test: Right pos ; Left pos   POSTURE: increased lumbar lordosis   LUMBAR ROM:   Active  A/PROM  eval  Flexion Deferred due to pregnancy  Extension   Right lateral flexion   Left lateral flexion   Right rotation   Left rotation    (Blank rows = not tested)  LOWER EXTREMITY ROM:    All WFL  LOWER EXTREMITY MMT:   All generally 4+/5  LUMBAR SPECIAL TESTS:  Straight leg raise test: Negative and FABER test: Positive  FUNCTIONAL TESTS:  Initial eval: 5 times sit to stand: 8.24 sec Timed up and go (TUG): 9.30   09/10/22:   5 times sit to stand: 7.77 sec Timed up and go (TUG): 6.00 GAIT: Distance walked: 50 Assistive device utilized: None Level of assistance: Complete Independence Comments: slow/guarded  TODAY'S TREATMENT 10/01/22 NuStep x 5 min level 5 Pelvic tilt x 20 Supine hamstring stretch x 3 each LE (wanted to do sitting up) Isometric hip abduction x 10 hold 10 sec Isometric hip adduction x 10 hold 10 sec Supine lower trunk rotation x 20 Quadruped cat/cow x 10 Cat with childs pose with hips abducted x 3 hold 30 sec  TODAY'S TREATMENT 09/29/22 NuStep x 5 min level 1 Pelvic tilt x 20 Supine hamstring stretch x 3 each LE  Isometric hip abduction x 10 hold 10 sec Isometric hip adduction x 10 hold 10 sec Hook lying clam x 20 Supine lower trunk rotation x 20 Quadruped cat/cow x 10 Cat with childs pose with hips abducted x 3 hold 30 sec  TODAY'S TREATMENT 09/25/22 NuStep x 5 min level 1 Pelvic tilt x 20 Supine hamstring stretch x 3 each LE  Isometric hip abduction x 10 hold 10 sec Isometric hip adduction x 10 hold 10 sec Hook lying clam x 20 Supine lower trunk rotation x 20 Quadruped cat/cow x 10 Cat with childs pose with hips  abducted x 3 hold 30 sec   PATIENT EDUCATION: any treatment administered  on first visit was not billed due to insurance restrictions Education details: Initiated HEP Person educated: Patient Education method: Consulting civil engineer, Media planner, Verbal cues, and Handouts Education comprehension: verbalized understanding, returned demonstration, and verbal cues required   HOME EXERCISE PROGRAM:  Access Code: [email or web address] URL: [email or web address] Date: 08/19/2022 Prepared by: Candyce Churn  Exercises - Supine Hamstring Stretch with Strap  - 1 x daily - 7 x weekly - 1 sets - 5 reps - 10 hold - Supine Butterfly Groin Stretch  - 1 x daily - 7 x weekly - 1 sets - 5 reps - 10 hold - Standing Hip Flexor Stretch with Foot Elevated  - 1 x daily - 7 x weekly - 1 sets - 5 reps - 10 hold - Supine Hip Internal and External Rotation  - 1 x daily - 7 x weekly - 1 sets - 5 reps - 10 hold  ASSESSMENT:  CLINICAL IMPRESSION: Pt is continuing with belly band to offload her pelvis and reports reduced SI joint pain. She is compliant with HEP.    She should continue to respond well to skilled PT for LE flexibility, lumbar ROM acitivities,  instruction in use of belly band and modalities for pain relief to allow her to make it to her due date/scheduled C section without having to be placed on bed rest.  2 more sessions next week unless she begins labor, then C section on 10/12/22.      OBJECTIVE IMPAIRMENTS difficulty walking, decreased ROM, decreased strength, increased fascial restrictions, increased muscle spasms, impaired flexibility, and pain.   ACTIVITY LIMITATIONS carrying, lifting, bending, sitting, standing, squatting, sleeping, stairs, transfers, and dressing  PARTICIPATION LIMITATIONS: meal prep, cleaning, laundry, driving, shopping, community activity, occupation, and yard work  PERSONAL FACTORS 1 comorbidity: pregnancy  are also affecting patient's functional outcome.   REHAB POTENTIAL:  Good  CLINICAL DECISION MAKING: Stable/uncomplicated  EVALUATION COMPLEXITY: Low   GOALS: Goals reviewed with patient? Yes  SHORT TERM GOALS: Target date: 09/16/2022  Patient will be independent with initial HEP  Baseline: Goal status: MET  2.  Pain report to be no greater than 4/10  Baseline: 6/10 max  Goal status: in progress   LONG TERM GOALS: Target date: 10/14/2022  Patient to be independent with advanced HEP  Baseline:  Goal status: MET  2.  Patient to report pain no greater than 2/10  Baseline:  Goal status: IN PROGRESS  3.  Oswestry score to improve by 10 points Baseline:  Goal status: IN PROGRESS  4.  Patient to be able to transfer in/out bed without pain Baseline: 6/10 with good technique Goal status: In progress   5.  Patient to report 50% improvement pre-partem  Baseline: 25% (09/08/22) Goal status: MET  6.  Patient to report 85% improvement post partem Baseline:  Goal status: IN PROGRESS   PLAN: PT FREQUENCY: 1-2x/week  PT DURATION: 8 weeks  PLANNED INTERVENTIONS: Therapeutic exercises, Therapeutic activity, Neuromuscular re-education, Balance training, Gait training, Patient/Family education, Self Care, Joint mobilization, Stair training, Aquatic Therapy, Dry Needling, Spinal mobilization, Cryotherapy, Moist heat, Taping, Manual therapy, and Re-evaluation.  PLAN FOR NEXT SESSION:  NuStep, modified core strengthening,1 move visit probable   Sigurd Sos, PT 10/01/22 4:15 PM  [facility] [address] [place], [place] [address] Phone # [phone] Fax [phone]

## 2022-10-02 ENCOUNTER — Encounter (HOSPITAL_COMMUNITY): Payer: Self-pay

## 2022-10-06 ENCOUNTER — Ambulatory Visit: Payer: Medicaid Other | Admitting: *Deleted

## 2022-10-06 ENCOUNTER — Ambulatory Visit: Payer: Medicaid Other | Attending: Obstetrics

## 2022-10-06 ENCOUNTER — Encounter: Payer: Self-pay | Admitting: *Deleted

## 2022-10-06 VITALS — BP 103/67 | HR 89

## 2022-10-06 DIAGNOSIS — O24414 Gestational diabetes mellitus in pregnancy, insulin controlled: Secondary | ICD-10-CM | POA: Diagnosis not present

## 2022-10-06 DIAGNOSIS — Z98891 History of uterine scar from previous surgery: Secondary | ICD-10-CM | POA: Diagnosis present

## 2022-10-06 DIAGNOSIS — O24419 Gestational diabetes mellitus in pregnancy, unspecified control: Secondary | ICD-10-CM

## 2022-10-06 DIAGNOSIS — O09523 Supervision of elderly multigravida, third trimester: Secondary | ICD-10-CM | POA: Diagnosis not present

## 2022-10-06 DIAGNOSIS — O24415 Gestational diabetes mellitus in pregnancy, controlled by oral hypoglycemic drugs: Secondary | ICD-10-CM | POA: Insufficient documentation

## 2022-10-06 DIAGNOSIS — Z3A36 36 weeks gestation of pregnancy: Secondary | ICD-10-CM | POA: Diagnosis not present

## 2022-10-06 DIAGNOSIS — O34219 Maternal care for unspecified type scar from previous cesarean delivery: Secondary | ICD-10-CM

## 2022-10-07 ENCOUNTER — Ambulatory Visit: Payer: Medicaid Other

## 2022-10-07 DIAGNOSIS — M6281 Muscle weakness (generalized): Secondary | ICD-10-CM

## 2022-10-07 DIAGNOSIS — M5459 Other low back pain: Secondary | ICD-10-CM | POA: Diagnosis not present

## 2022-10-07 DIAGNOSIS — R262 Difficulty in walking, not elsewhere classified: Secondary | ICD-10-CM

## 2022-10-07 DIAGNOSIS — R252 Cramp and spasm: Secondary | ICD-10-CM

## 2022-10-07 NOTE — Therapy (Signed)
OUTPATIENT PHYSICAL THERAPY TREATMENT   Patient Name: Susan Suarez MRN: [id number] DOB:1986/11/26, 36 y.o., female Today's Date: 10/07/2022   PT End of Session - 10/07/22 1545     Visit Number 10    Number of Visits 11    Date for PT Re-Evaluation 10/14/22    Authorization Type [place] Medicaid Healthy [employer]    Authorization Time Period [employer] Approved 6 visits, 08/19/2022-10/17/2022, auth#[id number] Approved 4 more visits from 09/27/22 through 10/10/22 (Patient induction scheduled for 10/12/22)    Authorization - Visit Number 10    Authorization - Number of Visits 11    Progress Note Due on Visit 11    PT Start Time 1540    PT Stop Time 1620    PT Time Calculation (min) 40 min    Activity Tolerance Patient tolerated treatment well    Behavior During Therapy [facility] for tasks assessed/performed               Past Medical History:  Diagnosis Date   Anxiety    Depression    Gestational diabetes    Past Surgical History:  Procedure Laterality Date   CESAREAN SECTION     WISDOM TOOTH EXTRACTION     Patient Active Problem List   Diagnosis Date Noted   GDM, class A2 08/14/2022   Supervision of normal pregnancy, antepartum 04/16/2022   AMA (advanced maternal age) multigravida 35+ 04/16/2022   History of actinic keratosis 04/16/2022   Previous Classical Cesarean Section 04/16/2022   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 10/30/2021   Rosacea 05/14/2021   Recurrent major depressive disorder, in full remission ([place]) 10/28/2017   Cervical neuropathy 04/07/2016   Generalized anxiety disorder 04/07/2016    PCP: Nickola Major, MD  REFERRING PROVIDER: Donnamae Jude, MD   REFERRING DIAG: [phone] (ICD-10-CM) - Bilateral sciatica   Rationale for Evaluation and Treatment Rehabilitation  THERAPY DIAG:  Other low back pain  Cramp and spasm  Difficulty in walking, not elsewhere classified  Muscle weakness  (generalized)  ONSET DATE: 07/22/22  SUBJECTIVE:                                                                                                                                                                                           SUBJECTIVE STATEMENT: No new complaints.  Pain is manageable.  Reports pain at the moment at 0/10.      PERTINENT HISTORY:  MD notes: Susan Suarez is a 36 y.o. G4P1021 at 22w3dbeing seen today for ongoing prenatal care.  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   PAIN:  Are you having pain? Yes: NPRS scale: 0/10 Pain location: low back when walking or standing for long periods of time, worst is when I get up out of bed  Pain description: aching Aggravating factors: standing/walking Relieving factors: rest/meds   PRECAUTIONS: Other: pregnancy approx 30+ weeks  WEIGHT BEARING RESTRICTIONS No  FALLS:  Has patient fallen in last 6 months? No  LIVING ENVIRONMENT: Lives with: lives with their family and lives with their spouse Lives in: House/apartment  OCCUPATION: Desk job  PLOF: Independent  PATIENT GOALS to relieve pain and make it to C section date without having to be out of work or have to be bedridden  OBJECTIVE:   DIAGNOSTIC FINDINGS:  none  PATIENT SURVEYS:  Initial eval: Modified Oswestry 18 (moderate disability)  09/10/22:  Modified Oswestry 10(mild disability)   SCREENING FOR RED FLAGS: Bowel or bladder incontinence: No Spinal tumors: No Cauda equina syndrome: No Compression fracture: No Abdominal  aneurysm: No  COGNITION:  Overall cognitive status: Within functional limits for tasks assessed     SENSATION: WFL  MUSCLE LENGTH: Hamstrings: Right 75 deg; Left 75 deg Thomas test: Right pos ; Left pos   POSTURE: increased lumbar lordosis   LUMBAR ROM:   Active  A/PROM  eval  Flexion Deferred due to pregnancy  Extension   Right lateral flexion   Left lateral flexion   Right rotation   Left rotation    (Blank rows = not tested)  LOWER EXTREMITY ROM:    All WFL  LOWER EXTREMITY MMT:   All generally 4+/5  LUMBAR SPECIAL TESTS:  Straight leg raise test: Negative and FABER test: Positive  FUNCTIONAL TESTS:  Initial eval: 5 times sit to stand: 8.24 sec Timed up and go (TUG): 9.30   09/10/22:   5 times sit to stand: 7.77 sec Timed up and go (TUG): 6.00 GAIT: Distance walked: 50 Assistive device utilized: None Level of assistance: Complete Independence Comments: slow/guarded  TODAY'S TREATMENT 10/07/22 NuStep x 5 min level 5 Pelvic tilt x 20 Supine hamstring stretch x 3 each LE (wanted to do sitting up) Isometric hip abduction x 10 hold 10 sec Isometric hip adduction x 10 hold 10 sec Supine lower trunk rotation x 20 Quadruped cat/cow x 10 Cat with childs pose with hips abducted x 3 hold 30 sec  TODAY'S TREATMENT 10/01/22 NuStep x 5 min level 5 Pelvic tilt x 20 Supine hamstring stretch x 3 each LE (wanted to do sitting up) Isometric hip abduction x 10 hold 10 sec Isometric hip adduction x 10 hold 10 sec Supine lower trunk rotation x 20 Quadruped cat/cow x 10 Cat with childs pose with hips abducted x 3 hold 30 sec  TODAY'S TREATMENT 09/29/22 NuStep x 5 min level 1 Pelvic tilt x 20 Supine hamstring stretch x 3 each LE  Isometric hip abduction x 10 hold 10 sec Isometric hip adduction x 10 hold 10 sec Hook lying clam x 20 Supine lower trunk rotation x 20 Quadruped cat/cow x 10 Cat with childs pose with hips abducted x 3 hold 30 sec   PATIENT  EDUCATION: any treatment administered on first visit was not billed due to  insurance restrictions Education details: Initiated HEP Person educated: Patient Education method: Consulting civil engineer, Media planner, Verbal cues, and Handouts Education comprehension: verbalized understanding, returned demonstration, and verbal cues required   HOME EXERCISE PROGRAM:  Access Code: [email or web address] URL: [email or web address] Date: 08/19/2022 Prepared by: Candyce Churn  Exercises - Supine Hamstring Stretch with Strap  - 1 x daily - 7 x weekly - 1 sets - 5 reps - 10 hold - Supine Butterfly Groin Stretch  - 1 x daily - 7 x weekly - 1 sets - 5 reps - 10 hold - Standing Hip Flexor Stretch with Foot Elevated  - 1 x daily - 7 x weekly - 1 sets - 5 reps - 10 hold - Supine Hip Internal and External Rotation  - 1 x daily - 7 x weekly - 1 sets - 5 reps - 10 hold  ASSESSMENT:  CLINICAL IMPRESSION: Pt is continuing with belly band to offload her pelvis and reports reduced SI joint pain. She is compliant with HEP.     1 more session on Thursday unless she begins labor, then C section on 10/12/22.      OBJECTIVE IMPAIRMENTS difficulty walking, decreased ROM, decreased strength, increased fascial restrictions, increased muscle spasms, impaired flexibility, and pain.   ACTIVITY LIMITATIONS carrying, lifting, bending, sitting, standing, squatting, sleeping, stairs, transfers, and dressing  PARTICIPATION LIMITATIONS: meal prep, cleaning, laundry, driving, shopping, community activity, occupation, and yard work  PERSONAL FACTORS 1 comorbidity: pregnancy  are also affecting patient's functional outcome.   REHAB POTENTIAL: Good  CLINICAL DECISION MAKING: Stable/uncomplicated  EVALUATION COMPLEXITY: Low   GOALS: Goals reviewed with patient? Yes  SHORT TERM GOALS: Target date: 09/16/2022  Patient will be independent with initial HEP  Baseline: Goal status: MET  2.  Pain report to be no greater than  4/10  Baseline: 6/10 max  Goal status: in progress   LONG TERM GOALS: Target date: 10/14/2022  Patient to be independent with advanced HEP  Baseline:  Goal status: MET  2.  Patient to report pain no greater than 2/10  Baseline:  Goal status: MET  3.  Oswestry score to improve by 10 points Baseline:  Goal status: IN PROGRESS  4.  Patient to be able to transfer in/out bed without pain Baseline: 6/10 with good technique Goal status: In progress   5.  Patient to report 50% improvement pre-partem  Baseline: 25% (09/08/22) Goal status: MET  6.  Patient to report 85% improvement post partem Baseline:  Goal status: IN PROGRESS   PLAN: PT FREQUENCY: 1-2x/week  PT DURATION: 8 weeks  PLANNED INTERVENTIONS: Therapeutic exercises, Therapeutic activity, Neuromuscular re-education, Balance training, Gait training, Patient/Family education, Self Care, Joint mobilization, Stair training, Aquatic Therapy, Dry Needling, Spinal mobilization, Cryotherapy, Moist heat, Taping, Manual therapy, and Re-evaluation.  PLAN FOR NEXT SESSION:  NuStep, modified core strengthening,1 move visit probable   Nike Southwell B. Esther Broyles, PT 10/07/22 4:25 PM  [facility] [address] [place], [place] [address] Phone # [phone] Fax [phone]

## 2022-10-08 ENCOUNTER — Encounter: Payer: Medicaid Other | Admitting: Family Medicine

## 2022-10-08 ENCOUNTER — Other Ambulatory Visit (HOSPITAL_COMMUNITY)
Admission: RE | Admit: 2022-10-08 | Discharge: 2022-10-08 | Disposition: A | Discharge status: Home / Self Care | Payer: Medicaid Other | Source: Ambulatory Visit | Attending: Family Medicine | Admitting: Family Medicine

## 2022-10-08 ENCOUNTER — Ambulatory Visit (INDEPENDENT_AMBULATORY_CARE_PROVIDER_SITE_OTHER): Payer: Medicaid Other | Admitting: Family Medicine

## 2022-10-08 VITALS — BP 107/68 | HR 93 | Wt 162.0 lb

## 2022-10-08 DIAGNOSIS — Z3480 Encounter for supervision of other normal pregnancy, unspecified trimester: Secondary | ICD-10-CM

## 2022-10-08 DIAGNOSIS — O09523 Supervision of elderly multigravida, third trimester: Secondary | ICD-10-CM

## 2022-10-08 DIAGNOSIS — Z98891 History of uterine scar from previous surgery: Secondary | ICD-10-CM

## 2022-10-08 DIAGNOSIS — F411 Generalized anxiety disorder: Secondary | ICD-10-CM

## 2022-10-08 DIAGNOSIS — O24419 Gestational diabetes mellitus in pregnancy, unspecified control: Secondary | ICD-10-CM

## 2022-10-08 DIAGNOSIS — F3342 Major depressive disorder, recurrent, in full remission: Secondary | ICD-10-CM

## 2022-10-08 DIAGNOSIS — Z3A36 36 weeks gestation of pregnancy: Secondary | ICD-10-CM

## 2022-10-08 MED ORDER — RSV PRE-FUSION F A&B VAC RCMB 120 MCG/0.5ML IM SOLR: 0.5000 mL | Freq: Once | INTRAMUSCULAR | 0 refills | Status: DC

## 2022-10-08 MED ORDER — INSULIN NPH (HUMAN) (ISOPHANE) 100 UNIT/ML ~~LOC~~ SUSP: 30.0000 [IU] | Freq: Every day | SUBCUTANEOUS | 3 refills | Status: DC

## 2022-10-08 NOTE — Progress Notes (Signed)
   PRENATAL VISIT NOTE  Subjective:  Susan Suarez is a 36 y.o. G5P1031 at 34w3dbeing seen today for ongoing prenatal care.  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  Patient reports no complaints.  Contractions: Not present. Vag. Bleeding: None.  Movement: Present. Denies leaking of fluid.   The following portions of the patient's history were reviewed and updated as appropriate: allergies, current medications, past family history, past medical history, past social history, past surgical history and problem list.   Objective:   Vitals:   10/08/22 1337  BP: 107/68  Pulse: 93  Weight: 162 lb (73.5 kg)    Fetal Status: Fetal Heart Rate (bpm): 150 Fundal Height: 38 cm Movement: Present  Presentation: Vertex  General:  Alert, oriented and cooperative. Patient is in no acute distress.  Skin: Skin is warm and dry. No rash noted.   Cardiovascular: Normal heart rate noted  Respiratory: Normal respiratory effort, no problems with respiration noted  Abdomen: Soft, gravid, appropriate for gestational age.  Pain/Pressure: Absent     Pelvic: Cervical exam performed in the presence of a chaperone        Extremities: Normal range of motion.  Edema: None  Mental Status: Normal mood and affect. Normal behavior. Normal judgment and thought content.   Assessment and Plan:  Pregnancy: [id number] 357w3d. GDM, class A2 Fastings are in range and 2 hour pp are normal On 30 u insulin q hs - insulin NPH Human (NOVOLIN N) 100 UNIT/ML injection; Inject 0.3 mLs (30  Units total) into the skin at bedtime.  Dispense: 10 mL; Refill: 3  2. Supervision of other normal pregnancy, antepartum Wants RSV vaccine--rx given - RSV bivalent vaccine (ABRYSVO) 120 MCG/0.5ML injection; Inject 0.5 mLs into the muscle once for 1 dose.  Dispense: 0.5 mL; Refill: 0 - Cervicovaginal ancillary only - Strep Gp B NAA  3. Previous Classical Cesarean Section For RCS at 37 weeks  4. Multigravida of advanced maternal age in third trimester LR NIPT  5. Recurrent major depressive disorder, in full remission ([place]ersOn Zoloft  6. Generalized anxiety disorder On Zoloft  Preterm labor symptoms and general obstetric precautions including but not limited to vaginal bleeding, contractions, leaking of fluid and fetal movement were reviewed in detail with the patient. Please refer to After Visit Summary for other counseling recommendations.   No follow-ups on file.  Future Appointments  Date Time Provider [place]/19/2023  3:30 PM FiCandyce Churn, PT [facility] None  10/10/2022  9:00 AM [facility] 1 [facility] None    TaDonnamae JudeMD

## 2022-10-09 ENCOUNTER — Other Ambulatory Visit (HOSPITAL_BASED_OUTPATIENT_CLINIC_OR_DEPARTMENT_OTHER): Payer: Self-pay

## 2022-10-09 ENCOUNTER — Other Ambulatory Visit (HOSPITAL_COMMUNITY): Payer: Self-pay

## 2022-10-09 ENCOUNTER — Ambulatory Visit: Payer: Medicaid Other

## 2022-10-09 DIAGNOSIS — M5459 Other low back pain: Secondary | ICD-10-CM

## 2022-10-09 DIAGNOSIS — R262 Difficulty in walking, not elsewhere classified: Secondary | ICD-10-CM

## 2022-10-09 DIAGNOSIS — R252 Cramp and spasm: Secondary | ICD-10-CM

## 2022-10-09 DIAGNOSIS — M6281 Muscle weakness (generalized): Secondary | ICD-10-CM

## 2022-10-09 LAB — CERVICOVAGINAL ANCILLARY ONLY
Chlamydia: NEGATIVE
Comment: NEGATIVE
Comment: NORMAL
Neisseria Gonorrhea: NEGATIVE

## 2022-10-09 MED ORDER — ABRYSVO 120 MCG/0.5ML IM SOLR
INTRAMUSCULAR | 0 refills | Status: DC
  Filled 2022-10-09 (×2): qty 1, 1d supply, fill #0

## 2022-10-09 NOTE — Therapy (Signed)
OUTPATIENT PHYSICAL THERAPY TREATMENT   Patient Name: Susan Suarez MRN: [id number] DOB:1986/04/21, 36 y.o., female Today's Date: 10/09/2022   PT End of Session - 10/09/22 1528     Visit Number 11    Number of Visits 11    Date for PT Re-Evaluation 10/14/22    Authorization Type  Medicaid [employer]    Authorization Time Period [employer] Approved 6 visits, 08/19/2022-10/17/2022, auth#[id number] Approved 4 more visits from 09/27/22 through 10/10/22 (Patient induction scheduled for 10/12/22)    Authorization - Visit Number 10    Authorization - Number of Visits 11    Progress Note Due on Visit 11    PT Start Time 1527    PT Stop Time 1610    PT Time Calculation (min) 43 min    Activity Tolerance Patient tolerated treatment well    Behavior During Therapy WFL for tasks assessed/performed               Past Medical History:  Diagnosis Date   Anxiety    Depression    Gestational diabetes    Past Surgical History:  Procedure Laterality Date   CESAREAN SECTION     WISDOM TOOTH EXTRACTION     Patient Active Problem List   Diagnosis Date Noted   GDM, class A2 08/14/2022   Supervision of normal pregnancy, antepartum 04/16/2022   AMA (advanced maternal age) multigravida 35+ 04/16/2022   History of actinic keratosis 04/16/2022   Previous Classical Cesarean Section 04/16/2022   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 10/30/2021   Rosacea 05/14/2021   Recurrent Suarez depressive disorder, in full remission ([place]) 10/28/2017   Cervical neuropathy 04/07/2016   Generalized anxiety disorder 04/07/2016    PCP: Susan Major, MD  REFERRING PROVIDER: Donnamae Jude, MD   REFERRING DIAG: [phone] (ICD-10-CM) - Bilateral sciatica   Rationale for Evaluation and Treatment Rehabilitation  THERAPY DIAG:  Other low back pain  Cramp and spasm  Difficulty in walking, not elsewhere classified  Muscle weakness  (generalized)  ONSET DATE: 07/22/22  SUBJECTIVE:                                                                                                                                                                                           SUBJECTIVE STATEMENT: Feeling occasional sharp pains     PERTINENT HISTORY:  MD notes: Susan Suarez is a 36 y.o. G4P1021 at 27w3dbeing seen today for ongoing prenatal care.  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   PAIN:  Are you having pain? Yes: NPRS scale: 0/10 Pain location: low back when walking or standing for long periods of time, worst is when I get up out of bed  Pain description: aching Aggravating factors: standing/walking Relieving factors: rest/meds   PRECAUTIONS: Other: pregnancy approx 30+ weeks  WEIGHT BEARING RESTRICTIONS No  FALLS:  Has patient fallen in last 6 months? No  LIVING ENVIRONMENT: Lives with: lives with their family and lives with their spouse Lives in: House/apartment  OCCUPATION: Desk job  PLOF: Independent  PATIENT GOALS to relieve pain and make it to C section date without having to be out of work or have to be bedridden  OBJECTIVE:   DIAGNOSTIC FINDINGS:  none  PATIENT SURVEYS:  Initial eval: Modified Oswestry 18 (moderate disability)  09/10/22:  Modified Oswestry 10(mild disability)  10/09/22:  Modified Oswestry 9 (mild disability)  SCREENING FOR RED FLAGS: Bowel or bladder incontinence: No Spinal tumors: No Cauda equina syndrome: No Compression fracture: No Abdominal  aneurysm: No  COGNITION:  Overall cognitive status: Within functional limits for tasks assessed     SENSATION: WFL  MUSCLE LENGTH: Hamstrings: Right 75 deg; Left 75 deg Thomas test: Right pos ; Left pos   POSTURE: increased lumbar lordosis   LUMBAR ROM:   Active  A/PROM  eval  Flexion Deferred due to pregnancy  Extension   Right lateral flexion   Left lateral flexion   Right rotation   Left rotation    (Blank rows = not tested)  LOWER EXTREMITY ROM:    All WFL  LOWER EXTREMITY MMT:   All generally 4+ to 5/5  LUMBAR SPECIAL TESTS:  Straight leg raise test: Negative and FABER test: Positive  FUNCTIONAL TESTS:  Initial eval: 5 times sit to stand: 8.24 sec Timed up and go (TUG): 9.30    09/10/22:   5 times sit to stand: 7.77 sec Timed up and go (TUG): 6.00  10/09/22: 5 times sit to stand: 6.57 sec Timed up and go (TUG): 6.27  GAIT: Distance walked: 50 Assistive device utilized: None Level of assistance: Complete Independence Comments: slow/guarded  TODAY'S TREATMENT 10/09/22 NuStep x 5 min level 5 Pelvic tilt x 20 Supine lower trunk rotation x 20 Isometric hip abduction x 10 hold 10 sec Isometric hip adduction x 10 hold 10 sec Supine hamstring stretch x 3 each LE (wanted to do sitting up) Quadruped cat/cow x 10 Cat with childs pose with hips abducted x 3 hold 30 sec  TODAY'S TREATMENT 10/07/22 NuStep x 5 min level 5 Pelvic tilt x 20 Supine hamstring stretch x 3 each LE (wanted to do sitting up) Isometric hip abduction x 10 hold 10 sec Isometric hip adduction x 10 hold 10 sec Supine lower trunk rotation x 20 Quadruped cat/cow x 10 Cat with childs pose with hips abducted x 3 hold 30 sec  TODAY'S TREATMENT 10/01/22 NuStep x 5 min level 5 Pelvic tilt x 20 Supine hamstring stretch x 3 each LE (wanted to do sitting up) Isometric hip abduction x 10 hold 10 sec Isometric hip adduction x 10 hold 10 sec Supine lower trunk rotation x 20 Quadruped  cat/cow x 10 Cat with childs pose with hips abducted x 3 hold 30 sec  PATIENT  EDUCATION: any treatment administered on first visit was not billed due to insurance restrictions Education details: Initiated HEP Person educated: Patient Education method: Consulting civil engineer, Media planner, Verbal cues, and Handouts Education comprehension: verbalized understanding, returned demonstration, and verbal cues required   HOME EXERCISE PROGRAM:  Access Code: [email or web address] URL: [email or web address] Date: 08/19/2022 Prepared by: Candyce Churn  Exercises - Supine Hamstring Stretch with Strap  - 1 x daily - 7 x weekly - 1 sets - 5 reps - 10 hold - Supine Butterfly Groin Stretch  - 1 x daily - 7 x weekly - 1 sets - 5 reps - 10 hold - Standing Hip Flexor Stretch with Foot Elevated  - 1 x daily - 7 x weekly - 1 sets - 5 reps - 10 hold - Supine Hip Internal and External Rotation  - 1 x daily - 7 x weekly - 1 sets - 5 reps - 10 hold  ASSESSMENT:  CLINICAL IMPRESSION: Hester has been able to manage her pain well through her last few weeks of pregnancy.  She is very knowledgeable and compliant with her HEP.  She will be having C section on Sunday.  We will DC at this time as her pain will likely resolve with delivery.  We discussed that if she continues to have pain post delivery, MD can send new referral.    OBJECTIVE IMPAIRMENTS difficulty walking, decreased ROM, decreased strength, increased fascial restrictions, increased muscle spasms, impaired flexibility, and pain.   ACTIVITY LIMITATIONS carrying, lifting, bending, sitting, standing, squatting, sleeping, stairs, transfers, and dressing  PARTICIPATION LIMITATIONS: meal prep, cleaning, laundry, driving, shopping, community activity, occupation, and yard work  PERSONAL FACTORS 1 comorbidity: pregnancy  are also affecting patient's functional outcome.   REHAB POTENTIAL: Good  CLINICAL DECISION MAKING: Stable/uncomplicated  EVALUATION  COMPLEXITY: Low   GOALS: Goals reviewed with patient? Yes  SHORT TERM GOALS: Target date: 09/16/2022  Patient will be independent with initial HEP  Baseline: Goal status: MET  2.  Pain report to be no greater than 4/10  Baseline: 6/10 max  Goal status: MET   LONG TERM GOALS: Target date: 10/14/2022  Patient to be independent with advanced HEP  Baseline:  Goal status: MET  2.  Patient to report pain no greater than 2/10  Baseline:  Goal status: MET  3.  Oswestry score to improve by 10 points Baseline:  Goal status: MET  4.  Patient to be able to transfer in/out bed without pain Baseline: 6/10 with good technique Goal status: In progress   5.  Patient to report 50% improvement pre-partem  Baseline: 25% (09/08/22) Goal status: MET  6.  Patient to report 85% improvement post partem Baseline:  Goal status: MET   PLAN: PT FREQUENCY: 1-2x/week  PT DURATION: 8 weeks  PLANNED INTERVENTIONS: Therapeutic exercises, Therapeutic activity, Neuromuscular re-education, Balance training, Gait training, Patient/Family education, Self Care, Joint mobilization, Stair training, Aquatic Therapy, Dry Needling, Spinal mobilization, Cryotherapy, Moist heat, Taping, Manual therapy, and Re-evaluation.  PLAN FOR NEXT SESSION:  DC at this time  PHYSICAL THERAPY DISCHARGE SUMMARY  Visits from Start of Care: 11  Current functional level related to goals / functional outcomes: See above   Remaining deficits: See above   Education / Equipment: See above   Patient agrees to discharge. Patient goals were met. Patient is being discharged due to meeting the stated rehab goals.    Anderson Malta B. Felicha Frayne, PT 10/09/22 4:17 PM   [facility] [address] [place], [place] [address]  Phone # [phone] Fax [phone]

## 2022-10-10 ENCOUNTER — Encounter (HOSPITAL_COMMUNITY)
Admission: RE | Admit: 2022-10-10 | Discharge: 2022-10-10 | Disposition: A | Discharge status: Home / Self Care | Payer: Medicaid Other | Source: Ambulatory Visit | Attending: Family Medicine | Admitting: Family Medicine

## 2022-10-10 DIAGNOSIS — Z98891 History of uterine scar from previous surgery: Secondary | ICD-10-CM | POA: Diagnosis present

## 2022-10-10 LAB — CBC
HCT: 37.5 % (ref 36.0–46.0)
Hemoglobin: 12.1 g/dL (ref 12.0–15.0)
MCH: 29.2 pg (ref 26.0–34.0)
MCHC: 32.3 g/dL (ref 30.0–36.0)
MCV: 90.4 fL (ref 80.0–100.0)
Platelets: 185 10*3/uL (ref 150–400)
RBC: 4.15 MIL/uL (ref 3.87–5.11)
RDW: 13.3 % (ref 11.5–15.5)
WBC: 6.9 10*3/uL (ref 4.0–10.5)
nRBC: 0 % (ref 0.0–0.2)

## 2022-10-10 LAB — TYPE AND SCREEN
ABO/RH(D): A POS
Antibody Screen: NEGATIVE

## 2022-10-10 LAB — STREP GP B NAA: Strep Gp B NAA: NEGATIVE

## 2022-10-10 LAB — RPR: RPR Ser Ql: NONREACTIVE

## 2022-10-12 ENCOUNTER — Encounter (HOSPITAL_COMMUNITY)
Admission: RE | Disposition: A | Discharge status: Home / Self Care | Payer: Self-pay | Source: Home / Self Care | Attending: Family Medicine

## 2022-10-12 ENCOUNTER — Inpatient Hospital Stay (HOSPITAL_COMMUNITY)
Admission: RE | Admit: 2022-10-12 | Discharge: 2022-10-14 | DRG: 788 | Disposition: A | Discharge status: Home / Self Care | Payer: Medicaid Other | Attending: Family Medicine | Admitting: Family Medicine

## 2022-10-12 ENCOUNTER — Inpatient Hospital Stay (HOSPITAL_COMMUNITY): Payer: Medicaid Other | Admitting: Anesthesiology

## 2022-10-12 ENCOUNTER — Encounter (HOSPITAL_COMMUNITY): Payer: Self-pay | Admitting: Family Medicine

## 2022-10-12 DIAGNOSIS — Z3490 Encounter for supervision of normal pregnancy, unspecified, unspecified trimester: Secondary | ICD-10-CM

## 2022-10-12 DIAGNOSIS — Z87891 Personal history of nicotine dependence: Secondary | ICD-10-CM | POA: Diagnosis not present

## 2022-10-12 DIAGNOSIS — O34211 Maternal care for low transverse scar from previous cesarean delivery: Secondary | ICD-10-CM | POA: Diagnosis present

## 2022-10-12 DIAGNOSIS — O99344 Other mental disorders complicating childbirth: Secondary | ICD-10-CM | POA: Diagnosis present

## 2022-10-12 DIAGNOSIS — O09529 Supervision of elderly multigravida, unspecified trimester: Secondary | ICD-10-CM

## 2022-10-12 DIAGNOSIS — F3342 Major depressive disorder, recurrent, in full remission: Secondary | ICD-10-CM | POA: Diagnosis present

## 2022-10-12 DIAGNOSIS — O34219 Maternal care for unspecified type scar from previous cesarean delivery: Secondary | ICD-10-CM | POA: Diagnosis not present

## 2022-10-12 DIAGNOSIS — O24424 Gestational diabetes mellitus in childbirth, insulin controlled: Secondary | ICD-10-CM | POA: Diagnosis present

## 2022-10-12 DIAGNOSIS — Z3A37 37 weeks gestation of pregnancy: Secondary | ICD-10-CM

## 2022-10-12 DIAGNOSIS — F411 Generalized anxiety disorder: Secondary | ICD-10-CM | POA: Diagnosis present

## 2022-10-12 DIAGNOSIS — O9902 Anemia complicating childbirth: Secondary | ICD-10-CM | POA: Diagnosis present

## 2022-10-12 DIAGNOSIS — O24419 Gestational diabetes mellitus in pregnancy, unspecified control: Secondary | ICD-10-CM

## 2022-10-12 DIAGNOSIS — O09523 Supervision of elderly multigravida, third trimester: Secondary | ICD-10-CM

## 2022-10-12 DIAGNOSIS — Z98891 History of uterine scar from previous surgery: Principal | ICD-10-CM

## 2022-10-12 LAB — GLUCOSE, CAPILLARY
Glucose-Capillary: 79 mg/dL (ref 70–99)
Glucose-Capillary: 80 mg/dL (ref 70–99)

## 2022-10-12 SURGERY — Surgical Case
Anesthesia: Spinal

## 2022-10-12 MED ORDER — MEPERIDINE HCL 25 MG/ML IJ SOLN: 6.2500 mg | INTRAMUSCULAR | Status: DC | PRN

## 2022-10-12 MED ORDER — KETOROLAC TROMETHAMINE 30 MG/ML IJ SOLN
INTRAMUSCULAR | Status: AC
  Filled 2022-10-12: qty 1

## 2022-10-12 MED ORDER — ONDANSETRON HCL 4 MG/2ML IJ SOLN: 4.0000 mg | Freq: Three times a day (TID) | INTRAMUSCULAR | Status: DC | PRN

## 2022-10-12 MED ORDER — GABAPENTIN 300 MG PO CAPS
300.0000 mg | ORAL_CAPSULE | ORAL | Status: AC
  Administered 2022-10-12: 300 mg via ORAL

## 2022-10-12 MED ORDER — MENTHOL 3 MG MT LOZG: 1.0000 | LOZENGE | OROMUCOSAL | Status: DC | PRN

## 2022-10-12 MED ORDER — ENOXAPARIN SODIUM 40 MG/0.4ML IJ SOSY
40.0000 mg | PREFILLED_SYRINGE | INTRAMUSCULAR | Status: DC
  Administered 2022-10-13 – 2022-10-14 (×2): 40 mg via SUBCUTANEOUS
  Filled 2022-10-12 (×2): qty 0.4

## 2022-10-12 MED ORDER — BUPIVACAINE HCL (PF) 0.25 % IJ SOLN
INTRAMUSCULAR | Status: AC
  Filled 2022-10-12: qty 30

## 2022-10-12 MED ORDER — KETOROLAC TROMETHAMINE 30 MG/ML IJ SOLN
30.0000 mg | Freq: Four times a day (QID) | INTRAMUSCULAR | Status: AC
  Administered 2022-10-12 – 2022-10-13 (×4): 30 mg via INTRAVENOUS
  Filled 2022-10-12 (×4): qty 1

## 2022-10-12 MED ORDER — NALOXONE HCL 0.4 MG/ML IJ SOLN: 0.4000 mg | INTRAMUSCULAR | Status: DC | PRN

## 2022-10-12 MED ORDER — ACETAMINOPHEN 500 MG PO TABS: 1000.0000 mg | ORAL_TABLET | Freq: Four times a day (QID) | ORAL | Status: DC

## 2022-10-12 MED ORDER — MORPHINE SULFATE (PF) 0.5 MG/ML IJ SOLN
INTRAMUSCULAR | Status: DC | PRN
  Administered 2022-10-12: 150 ug via INTRATHECAL

## 2022-10-12 MED ORDER — GABAPENTIN 300 MG PO CAPS
ORAL_CAPSULE | ORAL | Status: AC
  Filled 2022-10-12: qty 1

## 2022-10-12 MED ORDER — LACTATED RINGERS IV SOLN: INTRAVENOUS | Status: DC

## 2022-10-12 MED ORDER — WITCH HAZEL-GLYCERIN EX PADS: 1.0000 | MEDICATED_PAD | CUTANEOUS | Status: DC | PRN

## 2022-10-12 MED ORDER — SOD CITRATE-CITRIC ACID 500-334 MG/5ML PO SOLN
30.0000 mL | ORAL | Status: AC
  Administered 2022-10-12: 30 mL via ORAL

## 2022-10-12 MED ORDER — SOD CITRATE-CITRIC ACID 500-334 MG/5ML PO SOLN
ORAL | Status: AC
  Filled 2022-10-12: qty 30

## 2022-10-12 MED ORDER — OXYTOCIN-SODIUM CHLORIDE 30-0.9 UT/500ML-% IV SOLN
INTRAVENOUS | Status: DC | PRN
  Administered 2022-10-12: 300 mL via INTRAVENOUS

## 2022-10-12 MED ORDER — ACETAMINOPHEN 10 MG/ML IV SOLN
INTRAVENOUS | Status: DC | PRN
  Administered 2022-10-12: 1000 mg via INTRAVENOUS

## 2022-10-12 MED ORDER — IBUPROFEN 600 MG PO TABS: 600.0000 mg | ORAL_TABLET | Freq: Four times a day (QID) | ORAL | Status: DC | PRN

## 2022-10-12 MED ORDER — PRENATAL MULTIVITAMIN CH
1.0000 | ORAL_TABLET | Freq: Every day | ORAL | Status: DC
  Administered 2022-10-13 – 2022-10-14 (×2): 1 via ORAL
  Filled 2022-10-12 (×2): qty 1

## 2022-10-12 MED ORDER — COCONUT OIL OIL
1.0000 | TOPICAL_OIL | Status: DC | PRN
  Administered 2022-10-13: 1 via TOPICAL

## 2022-10-12 MED ORDER — DIPHENHYDRAMINE HCL 25 MG PO CAPS: 25.0000 mg | ORAL_CAPSULE | Freq: Four times a day (QID) | ORAL | Status: DC | PRN

## 2022-10-12 MED ORDER — DIPHENHYDRAMINE HCL 25 MG PO CAPS: 25.0000 mg | ORAL_CAPSULE | ORAL | Status: DC | PRN

## 2022-10-12 MED ORDER — ONDANSETRON HCL 4 MG/2ML IJ SOLN
INTRAMUSCULAR | Status: AC
  Filled 2022-10-12: qty 2

## 2022-10-12 MED ORDER — GABAPENTIN 100 MG PO CAPS
200.0000 mg | ORAL_CAPSULE | Freq: Every day | ORAL | Status: DC
  Administered 2022-10-12 – 2022-10-13 (×2): 200 mg via ORAL
  Filled 2022-10-12 (×2): qty 2

## 2022-10-12 MED ORDER — ACETAMINOPHEN 500 MG PO TABS
1000.0000 mg | ORAL_TABLET | Freq: Four times a day (QID) | ORAL | Status: DC
  Administered 2022-10-12 – 2022-10-14 (×8): 1000 mg via ORAL
  Filled 2022-10-12 (×8): qty 2

## 2022-10-12 MED ORDER — SODIUM CHLORIDE 0.9% FLUSH: 3.0000 mL | INTRAVENOUS | Status: DC | PRN

## 2022-10-12 MED ORDER — SERTRALINE HCL 100 MG PO TABS: 100.0000 mg | ORAL_TABLET | Freq: Every day | ORAL | Status: DC

## 2022-10-12 MED ORDER — POVIDONE-IODINE 10 % EX SWAB: 2.0000 | Freq: Once | CUTANEOUS | Status: DC

## 2022-10-12 MED ORDER — IBUPROFEN 600 MG PO TABS
600.0000 mg | ORAL_TABLET | Freq: Four times a day (QID) | ORAL | Status: DC
  Administered 2022-10-13 – 2022-10-14 (×4): 600 mg via ORAL
  Filled 2022-10-12 (×4): qty 1

## 2022-10-12 MED ORDER — MAGNESIUM HYDROXIDE 400 MG/5ML PO SUSP: 30.0000 mL | ORAL | Status: DC | PRN

## 2022-10-12 MED ORDER — FENTANYL CITRATE (PF) 100 MCG/2ML IJ SOLN
INTRAMUSCULAR | Status: AC
  Filled 2022-10-12: qty 2

## 2022-10-12 MED ORDER — ZOLPIDEM TARTRATE 5 MG PO TABS: 5.0000 mg | ORAL_TABLET | Freq: Every evening | ORAL | Status: DC | PRN

## 2022-10-12 MED ORDER — SENNOSIDES-DOCUSATE SODIUM 8.6-50 MG PO TABS
2.0000 | ORAL_TABLET | Freq: Every day | ORAL | Status: DC
  Administered 2022-10-13 – 2022-10-14 (×2): 2 via ORAL
  Filled 2022-10-12 (×2): qty 2

## 2022-10-12 MED ORDER — PHENYLEPHRINE HCL-NACL 20-0.9 MG/250ML-% IV SOLN
INTRAVENOUS | Status: DC | PRN
  Administered 2022-10-12: 60 ug/min via INTRAVENOUS

## 2022-10-12 MED ORDER — SIMETHICONE 80 MG PO CHEW: 80.0000 mg | CHEWABLE_TABLET | ORAL | Status: DC | PRN

## 2022-10-12 MED ORDER — FENTANYL CITRATE (PF) 100 MCG/2ML IJ SOLN
INTRAMUSCULAR | Status: DC | PRN
  Administered 2022-10-12: 15 ug via INTRATHECAL

## 2022-10-12 MED ORDER — PHENYLEPHRINE HCL-NACL 20-0.9 MG/250ML-% IV SOLN
INTRAVENOUS | Status: AC
  Filled 2022-10-12: qty 500

## 2022-10-12 MED ORDER — HYDROMORPHONE HCL 1 MG/ML IJ SOLN: 0.2500 mg | INTRAMUSCULAR | Status: DC | PRN

## 2022-10-12 MED ORDER — OXYCODONE HCL 5 MG PO TABS: 5.0000 mg | ORAL_TABLET | ORAL | Status: DC | PRN

## 2022-10-12 MED ORDER — CEFAZOLIN SODIUM-DEXTROSE 2-4 GM/100ML-% IV SOLN
2.0000 g | INTRAVENOUS | Status: AC
  Administered 2022-10-12: 2 g via INTRAVENOUS

## 2022-10-12 MED ORDER — PROMETHAZINE HCL 25 MG/ML IJ SOLN: 6.2500 mg | INTRAMUSCULAR | Status: DC | PRN

## 2022-10-12 MED ORDER — MORPHINE SULFATE (PF) 0.5 MG/ML IJ SOLN
INTRAMUSCULAR | Status: AC
  Filled 2022-10-12: qty 10

## 2022-10-12 MED ORDER — OXYCODONE HCL 5 MG PO TABS: 5.0000 mg | ORAL_TABLET | Freq: Four times a day (QID) | ORAL | Status: DC | PRN

## 2022-10-12 MED ORDER — NALOXONE HCL 4 MG/10ML IJ SOLN: 1.0000 ug/kg/h | INTRAVENOUS | Status: DC | PRN

## 2022-10-12 MED ORDER — DIBUCAINE (PERIANAL) 1 % EX OINT: 1.0000 | TOPICAL_OINTMENT | CUTANEOUS | Status: DC | PRN

## 2022-10-12 MED ORDER — DIPHENHYDRAMINE HCL 50 MG/ML IJ SOLN: 12.5000 mg | INTRAMUSCULAR | Status: DC | PRN

## 2022-10-12 MED ORDER — KETOROLAC TROMETHAMINE 30 MG/ML IJ SOLN
30.0000 mg | Freq: Once | INTRAMUSCULAR | Status: AC | PRN
  Administered 2022-10-12: 30 mg via INTRAVENOUS

## 2022-10-12 MED ORDER — DEXMEDETOMIDINE HCL IN NACL 80 MCG/20ML IV SOLN
INTRAVENOUS | Status: AC
  Filled 2022-10-12: qty 20

## 2022-10-12 MED ORDER — OXYTOCIN-SODIUM CHLORIDE 30-0.9 UT/500ML-% IV SOLN: 2.5000 [IU]/h | INTRAVENOUS | Status: AC

## 2022-10-12 MED ORDER — ACETAMINOPHEN 500 MG PO TABS: 1000.0000 mg | ORAL_TABLET | ORAL | Status: DC

## 2022-10-12 MED ORDER — SCOPOLAMINE 1 MG/3DAYS TD PT72: 1.0000 | MEDICATED_PATCH | Freq: Once | TRANSDERMAL | Status: DC

## 2022-10-12 MED ORDER — BUPIVACAINE IN DEXTROSE 0.75-8.25 % IT SOLN
INTRATHECAL | Status: DC | PRN
  Administered 2022-10-12: 1.4 mL via INTRATHECAL

## 2022-10-12 MED ORDER — ONDANSETRON HCL 4 MG/2ML IJ SOLN
INTRAMUSCULAR | Status: DC | PRN
  Administered 2022-10-12: 4 mg via INTRAVENOUS

## 2022-10-12 MED ORDER — SIMETHICONE 80 MG PO CHEW
80.0000 mg | CHEWABLE_TABLET | Freq: Three times a day (TID) | ORAL | Status: DC
  Administered 2022-10-12 – 2022-10-14 (×6): 80 mg via ORAL
  Filled 2022-10-12 (×6): qty 1

## 2022-10-12 SURGICAL SUPPLY — 30 items
BENZOIN TINCTURE PRP APPL 2/3 (GAUZE/BANDAGES/DRESSINGS) ×1 IMPLANT
CHLORAPREP W/TINT 26 (MISCELLANEOUS) ×2 IMPLANT
CLAMP UMBILICAL CORD (MISCELLANEOUS) ×1 IMPLANT
CLOTH BEACON ORANGE TIMEOUT ST (SAFETY) ×1 IMPLANT
DRSG OPSITE POSTOP 4X10 (GAUZE/BANDAGES/DRESSINGS) ×1 IMPLANT
ELECT REM PT RETURN 9FT ADLT (ELECTROSURGICAL) ×1
ELECTRODE REM PT RTRN 9FT ADLT (ELECTROSURGICAL) ×1 IMPLANT
EXTRACTOR VACUUM M CUP 4 TUBE (SUCTIONS) IMPLANT
GLOVE BIOGEL PI IND STRL 7.0 (GLOVE) ×3 IMPLANT
GLOVE ECLIPSE 7.0 STRL STRAW (GLOVE) ×1 IMPLANT
GOWN STRL REUS W/TWL LRG LVL3 (GOWN DISPOSABLE) ×2 IMPLANT
KIT ABG SYR 3ML LUER SLIP (SYRINGE) ×1 IMPLANT
NDL HYPO 25X5/8 SAFETYGLIDE (NEEDLE) ×1 IMPLANT
NEEDLE HYPO 22GX1.5 SAFETY (NEEDLE) ×1 IMPLANT
NEEDLE HYPO 25X5/8 SAFETYGLIDE (NEEDLE) ×1 IMPLANT
NS IRRIG 1000ML POUR BTL (IV SOLUTION) ×1 IMPLANT
PACK C SECTION WH (CUSTOM PROCEDURE TRAY) ×1 IMPLANT
PAD ABD 7.5X8 STRL (GAUZE/BANDAGES/DRESSINGS) ×1 IMPLANT
PAD OB MATERNITY 4.3X12.25 (PERSONAL CARE ITEMS) ×1 IMPLANT
RTRCTR C-SECT PINK 25CM LRG (MISCELLANEOUS) ×1 IMPLANT
STRIP CLOSURE SKIN 1/2X4 (GAUZE/BANDAGES/DRESSINGS) ×1 IMPLANT
SUT MNCRL 0 VIOLET CTX 36 (SUTURE) ×2 IMPLANT
SUT MONOCRYL 0 CTX 36 (SUTURE) ×2
SUT VIC AB 0 CTX 36 (SUTURE) ×1
SUT VIC AB 0 CTX36XBRD ANBCTRL (SUTURE) ×1 IMPLANT
SUT VIC AB 4-0 KS 27 (SUTURE) ×1 IMPLANT
SYR 30ML LL (SYRINGE) ×1 IMPLANT
TOWEL OR 17X24 6PK STRL BLUE (TOWEL DISPOSABLE) ×1 IMPLANT
TRAY FOLEY W/BAG SLVR 14FR LF (SET/KITS/TRAYS/PACK) ×1 IMPLANT
WATER STERILE IRR 1000ML POUR (IV SOLUTION) ×1 IMPLANT

## 2022-10-12 NOTE — Anesthesia Preprocedure Evaluation (Signed)
Anesthesia Evaluation  Patient identified by MRN, date of birth, ID band Patient awake    Reviewed: Allergy & Precautions, H&P , Patient's Chart, lab work & pertinent test results  Airway Mallampati: I       Dental no notable dental hx.    Pulmonary neg pulmonary ROS, former smoker,    Pulmonary exam normal        Cardiovascular negative cardio ROS Normal cardiovascular exam     Neuro/Psych PSYCHIATRIC DISORDERS Anxiety Depression    GI/Hepatic negative GI ROS, Neg liver ROS,   Endo/Other  diabetes, Gestational, Insulin Dependent  Renal/GU negative Renal ROS  negative genitourinary   Musculoskeletal negative musculoskeletal ROS (+)   Abdominal Normal abdominal exam  (+)   Peds  Hematology negative hematology ROS (+)   Anesthesia Other Findings   Reproductive/Obstetrics (+) Pregnancy                             Anesthesia Physical Anesthesia Plan  ASA: 2  Anesthesia Plan: Spinal   Post-op Pain Management: Dilaudid IV and Minimal or no pain anticipated   Induction:   PONV Risk Score and Plan: 3 and Ondansetron, Dexamethasone and Scopolamine patch - Pre-op  Airway Management Planned: Natural Airway, Simple Face Mask and Nasal Cannula  Additional Equipment: None  Intra-op Plan:   Post-operative Plan:   Informed Consent: I have reviewed the patients History and Physical, chart, labs and discussed the procedure including the risks, benefits and alternatives for the proposed anesthesia with the patient or authorized representative who has indicated his/her understanding and acceptance.       Plan Discussed with: CRNA  Anesthesia Plan Comments:         Anesthesia Quick Evaluation

## 2022-10-12 NOTE — Anesthesia Procedure Notes (Signed)
Spinal  Patient location during procedure: OR Start time: 10/12/2022 8:57 AM End time: 10/12/2022 9:03 AM Reason for block: surgical anesthesia Staffing Performed: anesthesiologist  Anesthesiologist: Lyn Hollingshead, MD Performed by: Lyn Hollingshead, MD Authorized by: Lyn Hollingshead, MD   Preanesthetic Checklist Completed: patient identified, IV checked, site marked, risks and benefits discussed, surgical consent, monitors and equipment checked, pre-op evaluation and timeout performed Spinal Block Patient position: sitting Prep: DuraPrep and site prepped and draped Patient monitoring: continuous pulse ox and blood pressure Approach: midline Location: L3-4 Injection technique: single-shot Needle Needle type: Pencan  Needle gauge: 24 G Needle length: 10 cm Needle insertion depth: 5 cm Assessment Sensory level: T4 Events: CSF return

## 2022-10-12 NOTE — Op Note (Signed)
Susan Suarez PROCEDURE DATE: 10/12/2022  PREOPERATIVE DIAGNOSES: Intrauterine pregnancy at 64w0dweeks gestation; previous uterine incision classical  POSTOPERATIVE DIAGNOSES: The same, viable infant delivered  PROCEDURE: RepeatLow Transverse Cesarean Section  SURGEON:  Dr. TDarron Suarez ASSISTANT:  Susan Pal DO An experienced assistant was required given the standard of surgical care given the complexity of the case.  This assistant was needed for exposure, dissection, suctioning, retraction, instrument exchange, assisting with delivery with administration of fundal pressure, and for overall help during the procedure.  ANESTHESIOLOGY TEAM: Anesthesiologist: Susan Hollingshead MD CRNA: BAsher Muir CRNA Student Nurse Anesthetist: ICarin Hock RN  INDICATIONS: Susan GREFEis a 36y.o. [id number] 362w0dere for cesarean section secondary to the indications listed under preoperative diagnoses; please see preoperative note for further details.  The risks of surgery were discussed with the patient including but were not limited to: bleeding which may require transfusion or reoperation; infection which may require antibiotics; injury to bowel, bladder, ureters or other surrounding organs; injury to the fetus; need for additional procedures including hysterectomy in the event of a life-threatening hemorrhage; formation of adhesions; placental abnormalities wth subsequent pregnancies; incisional problems; thromboembolic phenomenon and other postoperative/anesthesia complications.  The patient concurred with the proposed plan, giving informed written consent for the procedure.    FINDINGS:  Viable female infant in cephalic presentation.  Apgars 8 and 9.  Amniotic fluid: clear.  Intact placenta, three vessel cord.  Normal uterus, fallopian tubes and ovaries bilaterally.  ANESTHESIA: spinal INTRAVENOUS FLUIDS: 1800 ml   ESTIMATED BLOOD LOSS: 394 ml URINE OUTPUT:  50  ml SPECIMENS: Placenta sent to L&D . COMPLICATIONS: None immediate  PROCEDURE IN DETAIL:  The patient preoperatively received intravenous antibiotics and had sequential compression devices applied to her lower extremities.  She was then taken to the operating room where spinal anesthesia was found to be adequate. She was then placed in a dorsal supine position with a leftward tilt, and prepped and draped in a sterile manner.  A foley catheter was  placed into her bladder and attached to constant gravity.  After an adequate timeout was performed, a Pfannenstiel skin incision was made with scalpel and carried through to the underlying layer of fascia. The fascia was incised in the midline, and this incision was extended bluntly. The rectus muscles were separated in the midline and the peritoneum was entered bluntly.   The Alexis self-retaining retractor was introduced into the abdominal cavity.  Attention was turned to the lower uterine segment where a low transverse hysterotomy was made with a scalpel and extended bluntly in caudad and cephalad directions.  The infant was successfully delivered, the cord was clamped and cut after one minute, and the infant was handed over to the awaiting neonatology team. Uterine massage was then administered, and the placenta delivered intact with a three-vessel cord. The uterus was then cleared of clots and debris.  The hysterotomy was closed with 0-Monocryl in a running fashion.   Figure-of-eight 0 monocryl serosal stitches were placed to help with hemostasis.    The pelvis was cleared of all clot and debris. Hemostasis was confirmed on all surfaces. The uterus was once again inspected and found to be hemostatic. The retractor was removed.  The peritoneum was closed with a 2-0 Monocryl running stitch. The fascia was then closed using 0 Vicryl in a running fashion.  The subcutaneous layer was irrigated, any areas of bleeding were cauterized with the bovie,  was found to  be hemostatic.. Subcutaneous tissue infused with 30cc 0.25% Marcaine. The skin was closed with a 4-0 Vicryl subcuticular stitch. The patient tolerated the procedure well. Sponge, instrument and needle counts were correct x 3.  She was taken to the recovery room in stable condition.   Shelda Suarez, [place] Fellow, Faculty practice [place] [facility] [place] 10/12/22  10:04 AM

## 2022-10-12 NOTE — Discharge Summary (Shared)
Postpartum Discharge Summary  Date of Service updated***     Patient Name: Susan Suarez DOB: 09/29/1986 MRN: [id number]  Date of admission: 10/12/2022 Delivery date:10/12/2022  Delivering provider: Donnamae Jude  Date of discharge: 10/12/2022  Admitting diagnosis: Previous cesarean delivery affecting pregnancy, antepartum [O34.219] Delivery of pregnancy by cesarean section [O82] Intrauterine pregnancy: [redacted]w[redacted]d    Secondary diagnosis:  Principal Problem:   Previous cesarean delivery affecting pregnancy, antepartum Active Problems:   Generalized anxiety disorder   Recurrent major depressive disorder, in full remission ([facility]   Supervision of normal pregnancy, antepartum   AMA (advanced maternal age) multigravida 35+   Previous Classical Cesarean Section   GDM, class A2   Delivery of pregnancy by cesarean section  Additional problems: ***    Discharge diagnosis: Term Pregnancy Delivered and GDM A2                                              Post partum procedures:{Postpartum procedures:23558} Augmentation: N/A Complications: None  Hospital course: Sceduled C/S   36y.o. yo [id number] 322w0das admitted to the hospital 10/12/2022 for scheduled cesarean section with the following indication:Prior Uterine Surgery.Delivery details are as follows:  Membrane Rupture Time/Date: 9:25 AM ,10/12/2022   Delivery Method:C-Section, Low Transverse  Details of operation can be found in separate operative note.  Patient had a postpartum course complicated by***.  She is ambulating, tolerating a regular diet, passing flatus, and urinating well. Patient is discharged home in stable condition on  10/12/22        Newborn Data: Birth date:10/12/2022  Birth time:9:26 AM  Gender:Female  Living status:Living  Apgars:8 ,9  Weight:3090 g     Magnesium Sulfate received: {Mag received:30440022} BMZ received: No Rhophylac:Yes MMR:Yes T-DaP:Given prenatally Flu: Yes Transfusion:{Transfusion  received:30440034}  Physical exam  Vitals:   10/12/22 1200 10/12/22 1320 10/12/22 1430 10/12/22 1535  BP: 112/78 120/77 104/65   Pulse: 76 76 80   Resp: 18 20 18 20   Temp: 97.8 F (36.6 C) 97.6 F (36.4 C) 98.1 F (36.7 C) 98.5 F (36.9 C)  TempSrc: Oral Oral Oral Oral  SpO2: 97% 100% 96% 95%  Weight:      Height:       General: {Exam; general:21111117} Lochia: {Desc; appropriate/inappropriate:30686::"appropriate"} Uterine Fundus: {Desc; firm/soft:30687} Incision: {Exam; incision:21111123} DVT Evaluation: {Exam; dvBTD:9741638}abs: Lab Results  Component Value Date   WBC 6.9 10/10/2022   HGB 12.1 10/10/2022   HCT 37.5 10/10/2022   MCV 90.4 10/10/2022   PLT 185 10/10/2022       No data to display         Edinburgh Score:     No data to display           After visit meds:  Allergies as of 10/12/2022       Reactions   Chlorhexidine Gluconate Rash   wash     Med Rec must be completed [facility]        Discharge home in stable condition Infant Feeding: {Baby feeding:23562} Infant Disposition:{CHL IP OB HOME WITH MOGTXMIW:80321}ischarge instruction: per After Visit Summary and Postpartum booklet. Activity: Advance as tolerated. Pelvic rest for 6 weeks.  Diet: {OB diYYQM:25003704}uture Appointments:No future appointments. Follow up Visit: [facility]  Please schedule this patient for a In person postpartum visit  in 6 weeks with the following provider: Any provider. Additional Postpartum F/U:Postpartum Depression checkup, 2 hour GTT, and Incision check 1 week  High risk pregnancy complicated by: GDM Delivery mode:  C-Section, Low Transverse  Anticipated Birth Control:  Condoms   10/12/2022 Shelda Pal, DO

## 2022-10-12 NOTE — Lactation Note (Signed)
This note was copied from a baby's chart. Lactation Consultation Note  Patient Name: Susan Suarez [id number] Date: 10/12/2022 Reason for consult: Initial assessment;Early term 37-38.6wks;Breastfeeding assistance;Maternal endocrine disorder Age:37 hours  P2 Experienced Breastfeeding Parent  LC entered the room and the infant was breastfeeding in a football position on the left breast.  Per the birth parent, the infant had been breastfeeding for about 15 min when the [facility] entered.  The infant was latched deeply with lips flanged, tongue was down, sucking was rhythmic, and some swallows were noted. The infant continued to feed for 15 min while the LC was in the room.  Per the birth parent, she breast fed for 6 months with her oldest.  She stated that she did not enjoy the experience.  The birth parent is aware that the infant is at [redacted] weeks gestation and may become sleepy at the breast.  The birth parent knows to hand express if the infant becomes sleepy or pump to protect her supply.  Per the birth parent, she remembers how to hand express.  She said that she does not have any concerns at the moment.   Current Feeding Plan:  Breastfeed 8+ times in 24 hours according to feeding cues.  Hand express and spoon feed EBM to the infant.  Call [facility] for assistance with breastfeeding.  Breastfeed   Maternal Data Has patient been taught Hand Expression?: Yes Does the patient have breastfeeding experience prior to this delivery?: Yes How long did the patient breastfeed?: 6 months  Feeding Mother's Current Feeding Choice: Breast Milk and Formula  LATCH Score Latch: Grasps breast easily, tongue down, lips flanged, rhythmical sucking.  Audible Swallowing: A few with stimulation  Type of Nipple: Everted at rest and after stimulation  Comfort (Breast/Nipple): Soft / non-tender  Hold (Positioning): No assistance needed to correctly position infant at breast.  LATCH Score:  9   Interventions Interventions: Breast feeding basics reviewed;Education;LC Services brochure  Discharge Pump: DEBP;Personal (Medela)  Consult Status Consult Status: Follow-up Date: 10/13/22 Follow-up type: In-patient    Lysbeth Penner 10/12/2022, 6:34 PM

## 2022-10-12 NOTE — H&P (Signed)
Susan Suarez is an 36 y.o. [phone] [id number].   Chief Complaint: Previous cesarean section HPI: Patient is pregnant with her second baby.  Pregnancy has been complicated by GDM A2 on insulin.  Previous pregnancy complicated by breech presentation with classical C-section at term. Requires repeat C-section at 37 weeks.  [facility] GDM A2 on insulin.  OB History     Gravida  5   Para  1   Term  1   Preterm      AB  3   Living  1      SAB  3   IAB      Ectopic      Multiple      Live Births  1           Past Medical History:  Diagnosis Date   Anxiety    Depression    Gestational diabetes     Past Surgical History:  Procedure Laterality Date   CESAREAN SECTION     WISDOM TOOTH EXTRACTION      Family History  Problem Relation Age of Onset   Diabetes Father    Cancer Maternal Aunt        cervical   Hypertension Maternal Grandmother    Heart disease Maternal Grandmother    Hypertension Maternal Grandfather    Hypertension Paternal Grandmother    Hypertension Paternal Grandfather    Diabetes Paternal Grandfather    Heart disease Paternal Grandfather    Asthma Neg Hx    Social History:  reports that she quit smoking about 4 years ago. Her smoking use included cigarettes. She has never used smokeless tobacco. She reports that she does not currently use alcohol. She reports that she does not currently use drugs.   No Known Allergies  Medications Prior to Admission  Medication Sig Dispense Refill   acetaminophen (TYLENOL) 500 MG tablet Take 1,000 mg by mouth every 8 (eight) hours as needed for moderate pain.     fexofenadine (ALLEGRA) 180 MG tablet Take 180 mg by mouth daily as needed for allergies.     hydrOXYzine (ATARAX) 25 MG tablet Take 25 mg by mouth every evening.     insulin NPH Human (NOVOLIN N) 100 UNIT/ML injection Inject 0.3 mLs (30  Units total) into the skin at bedtime. 10 mL 3   polyethylene glycol (MIRALAX / GLYCOLAX) 17 g packet Take 17 g by mouth daily.     Prenatal Vit-Fe Fumarate-FA (PRENATAL PO) Take 1 tablet by mouth daily.     sertraline (ZOLOFT) 100 MG tablet Take 1 tablet (100 mg total) by mouth daily. 90 tablet 3   Accu-Chek Softclix Lancets lancets 1 each by Other route 4 (four) times daily. 100 each 12   Blood Glucose Monitoring Suppl (ACCU-CHEK GUIDE) w/Device KIT 1 Device by Does not apply route 4 (four) times daily. 1 kit 0   glucose blood (ACCU-CHEK GUIDE) test strip Use to check blood sugars four times a day was instructed 50 each 12   Insulin Syringe-Needle U-100 30G X 15/64" 1 ML MISC 1 Device by Does not apply route as directed. 100 each 3   RSV bivalent vaccine (ABRYSVO) 120 MCG/0.5ML injection Inject into the muscle. 1 each 0     Pertinent items are noted in HPI.  Objective: Height 5' 2"  (1.575 m), weight 73.1 kg, last menstrual period 01/26/2022, unknown if currently breastfeeding.  General appearance: alert, cooperative, and appears stated age Head: Normocephalic, without obvious abnormality, atraumatic Neck: supple, symmetrical, trachea midline Lungs:  Normal effort Heart: regular rate and rhythm Abdomen:  Gravid, nontender Extremities: Homans sign is negative, no sign of DVT Skin:  Rash on her abdomen Neurologic: Grossly normal   Labs: No results found for this or any previous visit (from the past 24 hour(s)).          ABO, Rh: --/--/A POS (10/20 9758)  Antibody: NEG (10/20 0849)  Rubella: 4.96 (04/26 1123)  RPR: NON REACTIVE (10/20 0850)  HBsAg: Negative (04/26 1123)  HIV: Non Reactive (08/23 0842)  GBS: Negative/-- (10/18 1400)    Radiology studies: [place] MFM FETAL BPP WO NON STRESS  Result Date: 10/06/2022 ----------------------------------------------------------------------  OBSTETRICS REPORT                       (Signed Final 10/06/2022 08:15 am)  ---------------------------------------------------------------------- Patient Info  ID #:       [id number]                          D.O.B.:  06-11-1986 (36 yrs)  Name:       Susan Suarez               Visit Date: 10/06/2022 07:57 am ---------------------------------------------------------------------- Performed By  Attending:        Tama High MD        Ref. Address:     [place]  Performed By:     Eveline Keto         [facility]                                     [facility]                                                             [place] [facility]                                                             [facility]  Referred By:      [facility] ---------------------------------------------------------------------- Orders  #  Description                           Code        Ordered By  1  [place] MFM FETAL BPP WO NON  98921.19    YU FANG     STRESS  2  [place] MFM OB FOLLOW UP                   41740.81    YU FANG ----------------------------------------------------------------------  #  Order #                     Accession #                Episode #  1  [id number]                   [id number]                 [id number]  2  [id number]                   [id number]                 [id number] ---------------------------------------------------------------------- Indications  Gestational diabetes in pregnancy, insulin     O24.414  controlled  [redacted] weeks gestation of pregnancy                Z3A.29  Advanced maternal age multigravida 43+,        O8.523  third trimester  History of cesarean delivery, currently        O34.219  pregnant  Low Risk NIPS  Neg Horizon  Neg AFP ---------------------------------------------------------------------- Vital Signs  BP:          103/67 ---------------------------------------------------------------------- Fetal Evaluation  Num Of Fetuses:          1  Fetal Heart Rate(bpm):  148  Cardiac Activity:       Observed  Presentation:           Cephalic  Placenta:               Posterior Fundal  P. Cord Insertion:      Previously Visualized  Amniotic Fluid  AFI FV:      Within normal limits  AFI Sum(cm)     %Tile       Largest Pocket(cm)  12              37          6.5  RUQ(cm)       RLQ(cm)       LUQ(cm)        LLQ(cm)  0             0             5.5            6.5 ---------------------------------------------------------------------- Biophysical Evaluation  Amniotic F.V:   Within normal limits       F. Tone:        Observed  F. Movement:    Observed                   Score:          8/8  F. Breathing:   Observed ---------------------------------------------------------------------- Biometry  BPD:      91.4  mm     G. Age:  37w 1d         83  %    CI:        75.33   %    70 - 86  FL/HC:      19.5   %    20.1 - 22.1  HC:       334   mm     G. Age:  38w 1d         70  %    HC/AC:      0.98        0.93 - 1.11  AC:      340.7  mm     G. Age:  38w 0d         95  %    FL/BPD:     71.1   %    71 - 87  FL:         65  mm     G. Age:  33w 4d        2.6  %    FL/AC:      19.1   %    20 - 24  LV:        3.5  mm  Est. FW:    3048  gm    6 lb 12 oz      71  % ---------------------------------------------------------------------- OB History  Gravidity:    4         Term:   1        Prem:   0        SAB:   2  TOP:          0       Ectopic:  0        Living: 1 ---------------------------------------------------------------------- Gestational Age  LMP:           36w 1d        Date:  01/26/22                   EDD:   11/02/22  Clinical EDD:  36w 1d                                        EDD:   11/02/22  U/S Today:     36w 5d                                        EDD:   10/29/22  Best:          36w 1d     Det. By:  LMP  (01/26/22)          EDD:   11/02/22 ----------------------------------------------------------------------  Anatomy  Cranium:               Appears normal         Diaphragm:              Appears normal  Cavum:                 Appears normal         Stomach:                Appears normal, left  sided  Ventricles:            Appears normal         Kidneys:                Appear normal  Heart:                 Appears normal         Bladder:                Appears normal                         (4CH, axis, and                         situs) ---------------------------------------------------------------------- Cervix Uterus Adnexa  Cervix  Not visualized (advanced GA >24wks)  Uterus  No abnormality visualized.  Right Ovary  Not visualized.  Left Ovary  Not visualized.  Cul De Sac  No free fluid seen.  Adnexa  No adnexal mass visualized. ---------------------------------------------------------------------- Impression  Gestational diabetes. Patient takes insulin for control .  Fetal growth is appropriate for gestational age .Amniotic fluid  is normal and good fetal activity is seen .Antenatal testing is  reassuring. BPP 8/8. Cephalic presentation. Lower segment  appears intact with no myometrial thinning.  Patient will have repeat cesarean section on 10/12/22 (history  of classical CS). ---------------------------------------------------------------------- Recommendations  -No follow-up appointments were made .  -Postpartum screening for diabetes. ----------------------------------------------------------------------                  Tama High, MD Electronically Signed Final Report   10/06/2022 08:15 am ----------------------------------------------------------------------  [place] MFM OB FOLLOW UP  Result Date: 10/06/2022 ----------------------------------------------------------------------  OBSTETRICS REPORT                       (Signed Final 10/06/2022 08:15 am) ---------------------------------------------------------------------- Patient Info  ID  #:       [id number]                          D.O.B.:  [birth year] (36 yrs)  Name:       Susan Suarez               Visit Date: 10/06/2022 07:57 am ---------------------------------------------------------------------- Performed By  Attending:        Tama High MD        Ref. Address:     [place]  Performed By:     Eveline Keto         [facility]                                     [facility] at  [facility]                                                             Women  Referred By:      [facility] ---------------------------------------------------------------------- Orders  #  Description                           Code        Ordered By  1  [place] MFM FETAL BPP WO NON               [id number]    YU FANG     STRESS  2  [place] MFM OB FOLLOW UP                   [id number]    YU FANG ----------------------------------------------------------------------  #  Order #                     Accession #                Episode #  1  [id number]                   [id number]                 [id number]  2  [id number]                   [id number]                 [id number] ---------------------------------------------------------------------- Indications  Gestational diabetes in pregnancy, insulin     O24.414  controlled  [redacted] weeks gestation of pregnancy                Z3A.40  Advanced maternal age multigravida 26+,        O85.523  third trimester  History of cesarean delivery, currently        O34.219  pregnant  Low Risk NIPS  Neg Horizon  Neg AFP ---------------------------------------------------------------------- Vital Signs  BP:          103/67 ---------------------------------------------------------------------- Fetal Evaluation  Num Of Fetuses:         1  Fetal Heart Rate(bpm):  148  Cardiac Activity:       Observed  Presentation:            Cephalic  Placenta:               Posterior Fundal  P. Cord Insertion:      Previously Visualized  Amniotic Fluid  AFI FV:      Within normal limits  AFI Sum(cm)     %Tile       Largest Pocket(cm)  12              37          6.5  RUQ(cm)       RLQ(cm)       LUQ(cm)        LLQ(cm)  0             0             5.5            6.5 ---------------------------------------------------------------------- Biophysical Evaluation  Amniotic F.V:   Within normal limits       F. Tone:        Observed  F. Movement:    Observed                   Score:          8/8  F. Breathing:   Observed ---------------------------------------------------------------------- Biometry  BPD:      91.4  mm     G. Age:  37w 1d         83  %    CI:        75.33   %    70 - 86                                                          FL/HC:      19.5   %    20.1 - 22.1  HC:       334   mm     G. Age:  38w 1d         70  %    HC/AC:      0.98        0.93 - 1.11  AC:      340.7  mm     G. Age:  38w 0d         95  %    FL/BPD:     71.1   %    71 - 87  FL:         65  mm     G. Age:  33w 4d        2.6  %    FL/AC:      19.1   %    20 - 24  LV:        3.5  mm  Est. FW:    3048  gm    6 lb 12 oz      71  % ---------------------------------------------------------------------- OB History  Gravidity:    4         Term:   1        Prem:   0        SAB:   2  TOP:          0       Ectopic:  0        Living: 1 ---------------------------------------------------------------------- Gestational Age  LMP:           36w 1d        Date:  01/26/22                   EDD:   11/02/22  Clinical EDD:  36w 1d                                        EDD:   11/02/22  U/S Today:     36w 5d                                        EDD:  10/29/22  Best:          36w 1d     Det. By:  LMP  (01/26/22)          EDD:   11/02/22 ---------------------------------------------------------------------- Anatomy  Cranium:               Appears normal         Diaphragm:              Appears normal   Cavum:                 Appears normal         Stomach:                Appears normal, left                                                                        sided  Ventricles:            Appears normal         Kidneys:                Appear normal  Heart:                 Appears normal         Bladder:                Appears normal                         (4CH, axis, and                         situs) ---------------------------------------------------------------------- Cervix Uterus Adnexa  Cervix  Not visualized (advanced GA >24wks)  Uterus  No abnormality visualized.  Right Ovary  Not visualized.  Left Ovary  Not visualized.  Cul De Sac  No free fluid seen.  Adnexa  No adnexal mass visualized. ---------------------------------------------------------------------- Impression  Gestational diabetes. Patient takes insulin for control .  Fetal growth is appropriate for gestational age .Amniotic fluid  is normal and good fetal activity is seen .Antenatal testing is  reassuring. BPP 8/8. Cephalic presentation. Lower segment  appears intact with no myometrial thinning.  Patient will have repeat cesarean section on 10/12/22 (history  of classical CS). ---------------------------------------------------------------------- Recommendations  -No follow-up appointments were made .  -Postpartum screening for diabetes. ----------------------------------------------------------------------                  Tama High, MD Electronically Signed Final Report   10/06/2022 08:15 am ----------------------------------------------------------------------  [place] MFM FETAL BPP WO NON STRESS  Result Date: 09/30/2022 ----------------------------------------------------------------------  OBSTETRICS REPORT                       (Signed Final 09/30/2022 08:10 am) ---------------------------------------------------------------------- Patient Info  ID #:       [id number]                          D.O.B.:  24-Mar-1986 (36 yrs)  Name:        Wyonia Hough  Visit Date: 09/30/2022 07:50 am ---------------------------------------------------------------------- Performed By  Attending:        Tama High MD        Ref. Address:     [place]  Performed By:     [facility] [place]          [facility]                    RD[facility]                                     [facility] at                                                             [place] for                                                             Women  Referred By:      [facility] ---------------------------------------------------------------------- Orders  #  Description                           Code        Ordered By  1  [place] MFM FETAL BPP WO NON               [phone]    YU FANG     STRESS ----------------------------------------------------------------------  #  Order #                     Accession #                Episode #  1  [id number]                   [id number]                 [id number] ---------------------------------------------------------------------- Indications  Gestational diabetes in pregnancy, insulin     O24.414  controlled  Advanced maternal age multigravida 89+,        O54.523  third trimester  History of cesarean delivery, currently        O34.219  pregnant  [redacted] weeks gestation of pregnancy                Z3A.35  Low Risk NIPS  Neg Horizon  Neg AFP ---------------------------------------------------------------------- Vital Signs  BP:          114/69 ---------------------------------------------------------------------- Fetal Evaluation  Num Of Fetuses:         1  Fetal Heart Rate(bpm):  148  Cardiac  Activity:       Observed  Presentation:           Cephalic  Placenta:               Posterior  P. Cord Insertion:      Previously Visualized  Amniotic Fluid  AFI FV:      Within normal limits  AFI Sum(cm)     %Tile       Largest Pocket(cm)  20.5            77           6.1  RUQ(cm)       RLQ(cm)       LUQ(cm)        LLQ(cm)  4.5           5.7           6.1            4.2 ---------------------------------------------------------------------- Biophysical Evaluation  Amniotic F.V:   Pocket => 2 cm             F. Tone:        Observed  F. Movement:    Observed                   Score:          8/8  F. Breathing:   Observed ---------------------------------------------------------------------- OB History  Gravidity:    4         Term:   1        Prem:   0        SAB:   2  TOP:          0       Ectopic:  0        Living: 1 ---------------------------------------------------------------------- Gestational Age  LMP:           35w 2d        Date:  01/26/22                   EDD:   11/02/22  Clinical EDD:  35w 2d                                        EDD:   11/02/22  Best:          35w 2d     Det. By:  LMP  (01/26/22)          EDD:   11/02/22 ---------------------------------------------------------------------- Impression  Gestational diabetes. Patient takes insulin for control .  Amniotic fluid is normal and good fetal activity is seen  .Antenatal testing is reassuring. BPP 8/8. Cephalic  presentation.  Patient will have repeat cesarean section on 10/12/22 (history  of classical CS). ---------------------------------------------------------------------- Recommendations  -Fetal growth and BPP next week. ----------------------------------------------------------------------                  Tama High, MD Electronically Signed Final Report   09/30/2022 08:10 am ----------------------------------------------------------------------  [place] MFM FETAL BPP WO NON STRESS  Result Date: 09/22/2022 ----------------------------------------------------------------------  OBSTETRICS REPORT                       (Signed Final 09/22/2022 05:08 pm) ---------------------------------------------------------------------- Patient Info  ID #:       [id number]  D.O.B.:  [birth year]  (36 yrs)  Name:       Susan Suarez               Visit Date: 09/22/2022 03:53 pm ---------------------------------------------------------------------- Performed By  Attending:        Johnell Comings MD         Ref. Address:     [place]  Performed By:     Jacob Moores BS,       [facility]                    RDMS, RVT                                Fetal Care at                                                             [place] for                                                             Women  Referred By:      [facility] ---------------------------------------------------------------------- Orders  #  Description                           Code        Ordered By  1  [place] MFM FETAL BPP WO NON               [phone]    YU FANG     STRESS ----------------------------------------------------------------------  #  Order #                     Accession #                Episode #  1  [id number]                   [id number]                 [id number] ---------------------------------------------------------------------- Indications  Gestational diabetes in pregnancy, insulin     O24.414  controlled  Advanced maternal age multigravida 8+,        O13.523  third trimester  [redacted] weeks gestation of pregnancy                Z3A.34  History of cesarean delivery, currently        O34.219  pregnant  Low Risk NIPS  Neg Horizon  Neg AFP ---------------------------------------------------------------------- Fetal Evaluation  Num Of Fetuses:  1  Fetal Heart Rate(bpm):  137  Cardiac Activity:       Observed  Presentation:           Cephalic  Placenta:               Posterior  P. Cord Insertion:      Previously Visualized  Amniotic Fluid  AFI FV:      Within normal limits  AFI Sum(cm)     %Tile       Largest Pocket(cm)  19.2            71          6  RUQ(cm)       RLQ(cm)       LUQ(cm)        LLQ(cm)  6             4.3            4.8            4.1 ---------------------------------------------------------------------- Biophysical Evaluation  Amniotic F.V:   Pocket => 2 cm             F. Tone:        Observed  F. Movement:    Observed                   Score:          8/8  F. Breathing:   Observed ---------------------------------------------------------------------- OB History  Gravidity:    4         Term:   1        Prem:   0        SAB:   2  TOP:          0       Ectopic:  0        Living: 1 ---------------------------------------------------------------------- Gestational Age  LMP:           34w 1d        Date:  01/26/22                   EDD:   11/02/22  Clinical EDD:  34w 1d                                        EDD:   11/02/22  Best:          34w 1d     Det. By:  LMP  (01/26/22)          EDD:   11/02/22 ---------------------------------------------------------------------- Anatomy  Cranium:               Previously seen        LVOT:                   Previously seen  Cavum:                 Previously seen        Aortic Arch:            Previously seen  Ventricles:            Appears normal         Ductal Arch:            Previously seen  Choroid Plexus:        Previously seen  Diaphragm:              Appears normal  Cerebellum:            Previously seen        Stomach:                Appears normal, left                                                                        sided  Posterior Fossa:       Previously seen        Abdomen:                Previously seen  Nuchal Fold:           Not applicable (>77    Abdominal Wall:         Previously seen                         wks GA)  Face:                  Orbits and profile     Cord Vessels:           Previously seen                         previously seen  Lips:                  Previously seen        Kidneys:                Appear normal  Palate:                Not well visualized    Bladder:                Appears normal  Thoracic:              Appears normal          Spine:                  Limited views                                                                        previously seen  Heart:                 Previously seen        Upper Extremities:      Previously  Visualized  RVOT:                  Previously seen        Lower Extremities:      Previously seen  Other:  Fetus prev appears to be a female. Nasal bone prev visualized. Lenses          prev visualized. Feet prev visualized. Technically difficult due to fetal          position. ---------------------------------------------------------------------- Cervix Uterus Adnexa  Cervix  Not visualized (advanced GA >24wks)  Uterus  No abnormality visualized.  Right Ovary  Not visualized.  Left Ovary  Not visualized.  Cul De Sac  No free fluid seen.  Adnexa  No adnexal mass visualized. ---------------------------------------------------------------------- Comments  This patient was seen for a BPP due to gestational diabetes  that is treated with insulin.  She denies any problems since  her last exam.  A biophysical profile performed today was 8/8.  The AFI was 19.2 cm (within normal limits).  Due to her prior classical C-section, she already has a repeat  C-section scheduled on October 12, 2022 (at 37 weeks).  She will return in 1 week for another BPP. ----------------------------------------------------------------------                   Johnell Comings, MD Electronically Signed Final Report   09/22/2022 05:08 pm ----------------------------------------------------------------------  [place] MFM FETAL BPP WO NON STRESS  Result Date: 09/16/2022 ----------------------------------------------------------------------  OBSTETRICS REPORT                       (Signed Final 09/16/2022 05:14 pm) ---------------------------------------------------------------------- Patient Info  ID #:       [id number]                          D.O.B.:  March 02, 1986 (36 yrs)  Name:        MEYLIN STENZEL               Visit Date: 09/16/2022 04:05 pm ---------------------------------------------------------------------- Performed By  Attending:        Valeda Malm DO       Ref. Address:     [place]  Performed By:     Germain Osgood            [facility]                                                             [facility]  Referred By:      [place] ---------------------------------------------------------------------- Orders  #  Description                           Code        Ordered By  1  [place] MFM FETAL BPP WO NON               [phone]    YU FANG     STRESS ----------------------------------------------------------------------  #  Order #                     Accession #                Episode #  1  [id number]                   [id number]                 [id number] ---------------------------------------------------------------------- Indications  Gestational diabetes in pregnancy,             O24.415  controlled by oral hypoglycemic drugs  Advanced maternal age multigravida 29+,        O6.523  third trimester (36yo)  History of cesarean delivery, currently        O21.219  pregnant (Classical)  Low Risk NIPS  Neg Horizon  Neg AFP  [redacted] weeks gestation of pregnancy                Z3A.33 ---------------------------------------------------------------------- Fetal Evaluation  Num Of Fetuses:         1  Fetal Heart Rate(bpm):  127  Cardiac Activity:       Observed  Presentation:           Breech  Placenta:               Posterior  P. Cord Insertion:      Previously Visualized  Amniotic Fluid  AFI FV:      Within normal limits  AFI Sum(cm)     %Tile       Largest Pocket(cm)  15.78           57          5.97  RUQ(cm)       RLQ(cm)       LUQ(cm)         LLQ(cm)  5.97          3.9           2.55           3.36 ---------------------------------------------------------------------- Biophysical Evaluation  Amniotic F.V:   Within normal limits       F. Tone:        Observed  F. Movement:    Observed                   Score:          8/8  F. Breathing:   Observed ---------------------------------------------------------------------- Biometry  LV:       4.25  mm ---------------------------------------------------------------------- OB History  Gravidity:    4         Term:   1        Prem:   0        SAB:   2  TOP:          0       Ectopic:  0  Living: 1 ---------------------------------------------------------------------- Gestational Age  LMP:           33w 2d        Date:  01/26/22                   EDD:   11/02/22  Clinical EDD:  33w 2d                                        EDD:   11/02/22  Best:          33w 2d     Det. By:  LMP  (01/26/22)          EDD:   11/02/22 ---------------------------------------------------------------------- Anatomy  Ventricles:            Appears normal         Kidneys:                Appear normal  Diaphragm:             Appears normal         Bladder:                Appears normal  Stomach:               Appears normal, left                         sided ---------------------------------------------------------------------- Cervix Uterus Adnexa  Cervix  Not visualized (advanced GA >24wks)  Uterus  No abnormality visualized.  Right Ovary  Not visualized.  Left Ovary  Not visualized.  Cul De Sac  No free fluid seen.  Adnexa  No adnexal mass visualized. ---------------------------------------------------------------------- Comments  The patient is here for a BPP for gDM on Metf[place]. She still  has some elevated blood sugars. She is also a prior classical  CD.  She is at 33w 2d with EDD of 11/02/2022 dated by LMP  (01/26/22).  Sonographic findings  Single intrauterine pregnancy.  Observed fetal cardiac activity.  Breech presentation.   Interval fetal anatomy appears normal.  Amniotic fluid volume: Within normal limits. AFI: 15.78 cm.  MVP: 5.97 cm.  Placenta is Posterior.  BPP is 8/8.  Recommendations  1. BBPs weekly until delivery  2. Growth ultrasounds every 4 weeks until delivery  3. Delivery around 37 weeks for repeat cesarean delivery due  to prior classical CD ----------------------------------------------------------------------                  Valeda Malm, DO Electronically Signed Final Report   09/16/2022 05:14 pm ----------------------------------------------------------------------   Prenatal Transfer Tool  Maternal Diabetes: Yes:  Diabetes Type:  Insulin/Medication controlled Genetic Screening: Normal Maternal Ultrasounds/Referrals: Normal Fetal Ultrasounds or other Referrals:  None Maternal Substance Abuse:  No Significant Maternal Medications:  Meds include: Zoloft Other: Allegra, hydroxyzine, insulin Significant Maternal Lab Results: Group B Strep negative  Assessment/Plan Active Problems:   Generalized anxiety disorder   Recurrent major depressive disorder, in full remission ([place])   Supervision of normal pregnancy, antepartum   AMA (advanced maternal age) multigravida 35+   Previous Classical Cesarean Section   GDM, class A2  For repeat C-section Risks include but are not limited to bleeding, infection, injury to surrounding structures, including bowel, bladder and ureters, blood clots, and death.  Likelihood of success is high.    #MOF: Breast or bottle #  MOC: Condoms #Circ: No #Anemia: Hemoglobin is 12.1 Donnamae Jude 10/12/2022, 7:01 AM

## 2022-10-13 ENCOUNTER — Encounter (HOSPITAL_COMMUNITY): Payer: Self-pay | Admitting: Family Medicine

## 2022-10-13 LAB — CBC
HCT: 35.4 % — ABNORMAL LOW (ref 36.0–46.0)
Hemoglobin: 11.6 g/dL — ABNORMAL LOW (ref 12.0–15.0)
MCH: 30 pg (ref 26.0–34.0)
MCHC: 32.8 g/dL (ref 30.0–36.0)
MCV: 91.5 fL (ref 80.0–100.0)
Platelets: 164 10*3/uL (ref 150–400)
RBC: 3.87 MIL/uL (ref 3.87–5.11)
RDW: 13.6 % (ref 11.5–15.5)
WBC: 9.4 10*3/uL (ref 4.0–10.5)
nRBC: 0 % (ref 0.0–0.2)

## 2022-10-13 MED ORDER — HYDROCORTISONE 1 % EX CREA
TOPICAL_CREAM | Freq: Two times a day (BID) | CUTANEOUS | Status: DC | PRN
  Filled 2022-10-13: qty 28

## 2022-10-13 MED ORDER — SERTRALINE HCL 100 MG PO TABS
100.0000 mg | ORAL_TABLET | Freq: Every day | ORAL | Status: DC
  Administered 2022-10-13 (×2): 100 mg via ORAL
  Filled 2022-10-13 (×2): qty 1

## 2022-10-13 NOTE — Transfer of Care (Signed)
Immediate Anesthesia Transfer of Care Note  Patient: Susan Suarez  Procedure(s) Performed: CESAREAN SECTION  Patient Location: PACU  Anesthesia Type:Spinal  Level of Consciousness: awake  Airway & Oxygen Therapy: Patient Spontanous Breathing  Post-op Assessment: Report given to RN  Post vital signs: Reviewed and stable  Last Vitals:  Vitals Value Taken Time  BP 136/96 10/13/22 0620  Temp 36.6 C 10/13/22 0428  Pulse 110 10/13/22 0622  Resp 16 10/13/22 0428  SpO2 98 % 10/13/22 0622  Vitals shown include unvalidated device data.  Last Pain:  Vitals:   10/13/22 0840  TempSrc:   PainSc: 3       Patients Stated Pain Goal: 3 (16/10/96 0454)  Complications: No notable events documented.

## 2022-10-13 NOTE — Social Work (Signed)
MOB was referred for history of depression/anxiety.  * Referral screened out by Clinical Social Worker because none of the following criteria appear to apply:  ~ History of anxiety/depression during this pregnancy, or of post-partum depression following prior delivery.  ~ Diagnosis of anxiety and/or depression within last 3 years OR * MOB's symptoms currently being treated with medication and/or therapy.  Per chart review MOB, is currently prescribed Zoloft and Hydroxyzine.  Please contact the Clinical Social Worker if needs arise or by MOB request.  Susan Suarez, [place] Social Worker [phone]

## 2022-10-13 NOTE — Progress Notes (Signed)
POSTPARTUM PROGRESS NOTE  POD #1  Subjective:  Susan Suarez is a 36 y.o. [id number] s/p rLTCS at [redacted]w[redacted]d  She reports she doing well. No acute events overnight. She reports she is doing well. She denies any problems with ambulating, voiding or po intake. Denies nausea or vomiting. She has passed flatus. Pain is well controlled.  Lochia is minimal.  Objective: Blood pressure (!) 97/55, pulse 73, temperature 97.9 F (36.6 C), temperature source Oral, resp. rate 16, height '5\' 2"'$  (1.575 m), weight 73.1 kg, last menstrual period 01/26/2022, SpO2 96 %, unknown if currently breastfeeding.  Physical Exam:  General: alert, cooperative and no distress Chest: no respiratory distress Heart:regular rate, distal pulses intact Abdomen: soft, nontender,  Uterine Fundus: firm, appropriately tender DVT Evaluation: No calf swelling or tenderness Extremities: No LE edema Skin: warm, dry; incision clean/dry/intact w/ honeycomb dressing in place  Recent Labs    10/10/22 0850 10/13/22 0438  HGB 12.1 11.6*  HCT 37.5 35.4*    Assessment/Plan: Susan KEis a 36y.o. [id number]/p rLTCS at 364w0d POD#1 - Doing welll; pain well controlled. H/H appropriate  Routine postpartum care  OOB, ambulated  Lovenox for VTE prophylaxis Anemia: asymptomatic Contraception: Condoms Feeding: Breast  Dispo: Plan for discharge 10/24-10/25.   LOS: 1 day   SiGerlene FeeDO OB Fellow, FaHarrisonor [facility]0/23/2023, 6:42 AM

## 2022-10-14 ENCOUNTER — Ambulatory Visit: Payer: Medicaid Other

## 2022-10-14 MED ORDER — GABAPENTIN 100 MG PO CAPS: 200.0000 mg | ORAL_CAPSULE | Freq: Every day | ORAL | 0 refills | Status: DC

## 2022-10-14 MED ORDER — IBUPROFEN 600 MG PO TABS: 600.0000 mg | ORAL_TABLET | Freq: Four times a day (QID) | ORAL | 0 refills | Status: DC | PRN

## 2022-10-14 MED ORDER — ACETAMINOPHEN 500 MG PO TABS: 1000.0000 mg | ORAL_TABLET | Freq: Four times a day (QID) | ORAL | 0 refills | Status: DC

## 2022-10-14 MED ORDER — COCONUT OIL OIL: 1.0000 | TOPICAL_OIL | 0 refills | Status: DC | PRN

## 2022-10-14 NOTE — Lactation Note (Signed)
This note was copied from a baby's chart. Lactation Consultation Note  Patient Name: Susan Suarez [id number] Date: 10/14/2022 Reason for consult: Follow-up assessment;Early term 37-38.6wks Age:37 hours  P2: Early term infant at 37+0 weeks Feeding preference: Breast milk (Birth parent has decided to pump and bottle feed) Weight loss: 3%  Birth parent was very tearful when I arrived; she admitted to not wanting to latch baby to the breast.  It was obvious that this was a hard decision for her.  She feels like she is "emotionally detached" when she breast feeds.  I provided emotional support and reassured her that it is important she feed her baby in the way she desires to feed.  Discussed feelings and emotions regarding feeding.  Reminded birth parent that she will still be providing her own breast milk even if she pumps and bottle feeds.  Parent verbalized agreement.  After further discussion she stated that she felt better.  She is willing to continue to pump and has a Medela pump.    Discussed pumping every three hours for 15 minutes.  Reviewed milk storage times and parents had no questions regarding bottle feeding.  They have a selection of nipples at home and will take a few yellow slow flow nipples since this is the one baby is currently using.  Provided our op phone number for any questions after discharge.  Support person present.    Maternal Data    Feeding Mother's Current Feeding Choice: Breast Milk and Formula Nipple Type: Slow - flow  LATCH Score                    Lactation Tools Discussed/Used    Interventions Interventions: Education  Discharge Discharge Education: Engorgement and breast care Pump: Personal (Medela) WIC Program: No  Consult Status Consult Status: Complete Date: 10/14/22 Follow-up type: Call as needed    Romelle Reiley R Dwane Andres 10/14/2022, 9:53 AM

## 2022-10-15 ENCOUNTER — Encounter: Payer: Medicaid Other | Admitting: Family Medicine

## 2022-10-16 IMAGING — US US OB < 14 WEEKS - US OB TV
1 series · 15 of 28 positions shown · non-contrast
Comparison: Obstetric ultrasound from a prior pregnancy 02/24/2021

CLINICAL DATA: Bleeding for 24 hours

EXAM:
OBSTETRIC <14 WK US AND TRANSVAGINAL OB US
TECHNIQUE: Both transabdominal and transvaginal ultrasound examinations were
performed for complete evaluation of the gestation as well as the
maternal uterus, adnexal regions, and pelvic cul-de-sac.
Transvaginal technique was performed to assess early pregnancy.

[Series 1: us ob < 14 weeks - us ob tv · 71 acquisitions, 15 frames shown]
[im 1/71]
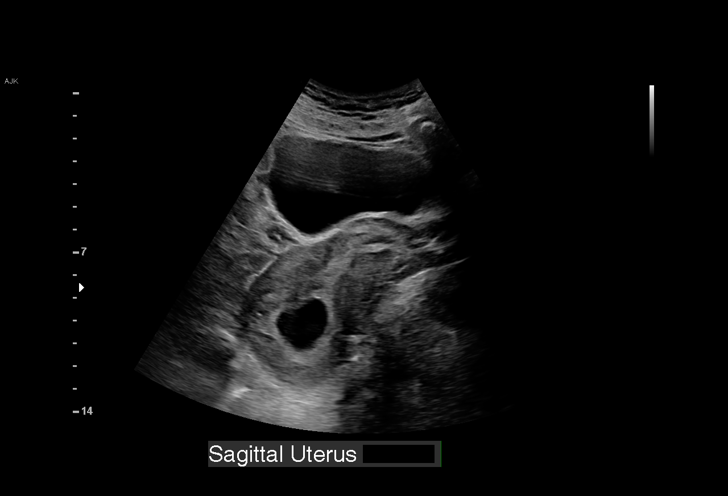
[im 6/71]
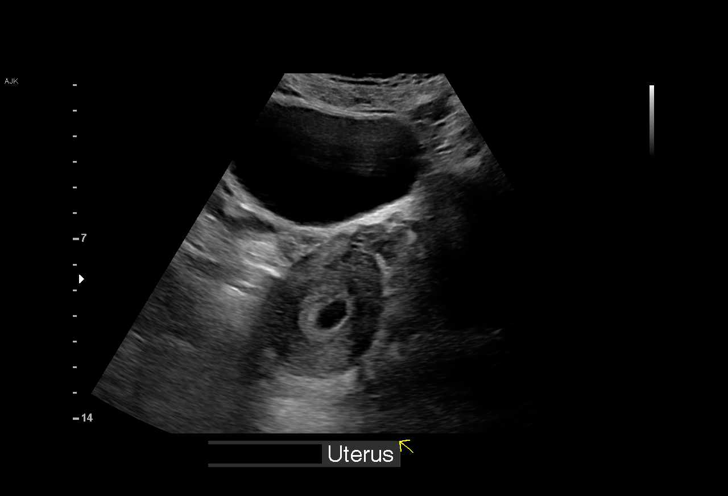
[im 11/71]
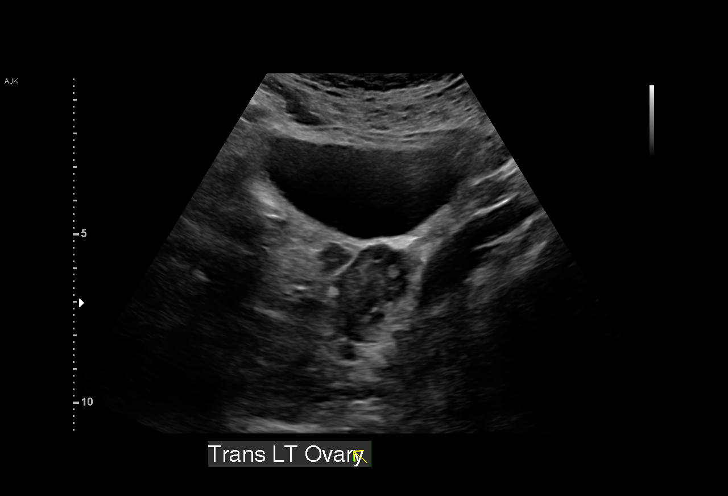
[im 16/71]
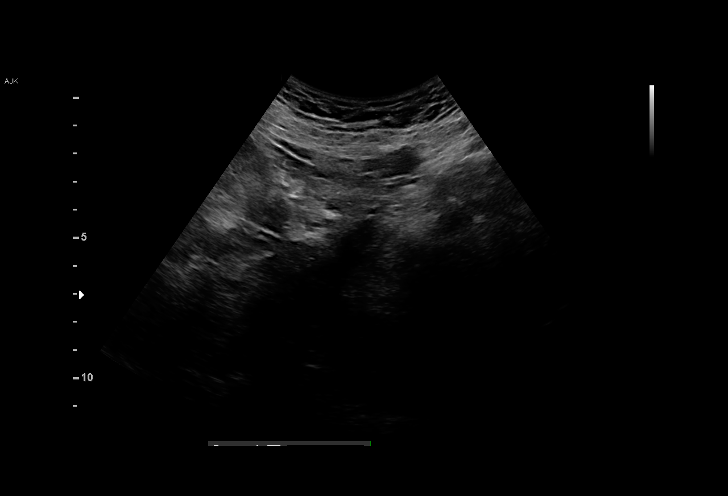
[im 21/71]
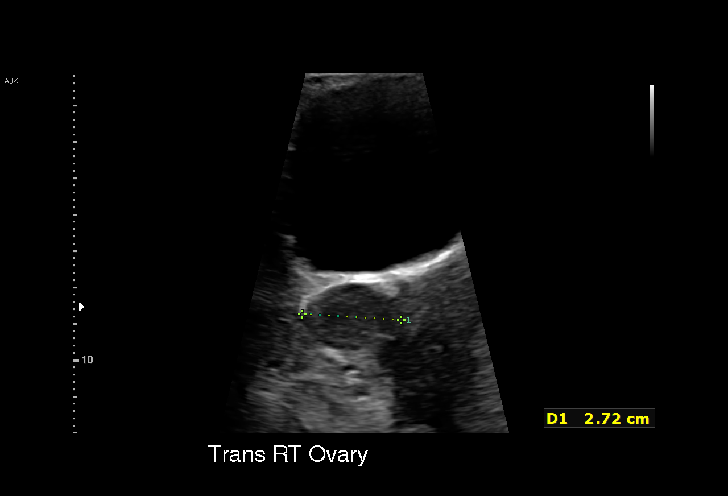
[im 26/71]
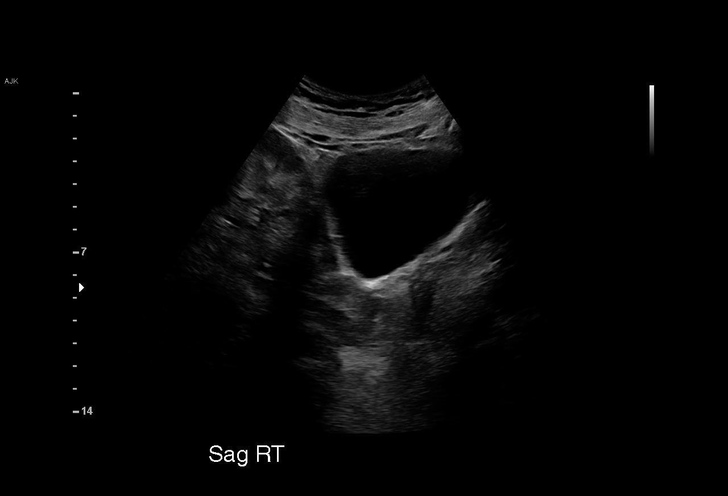
[im 32/71]
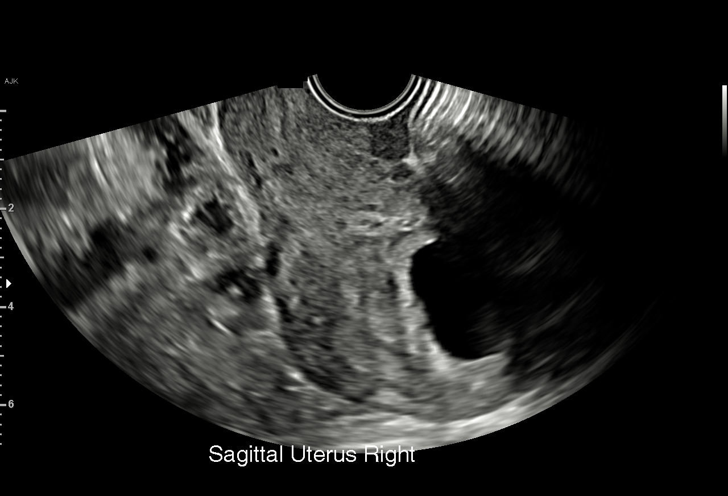
[im 37/71]
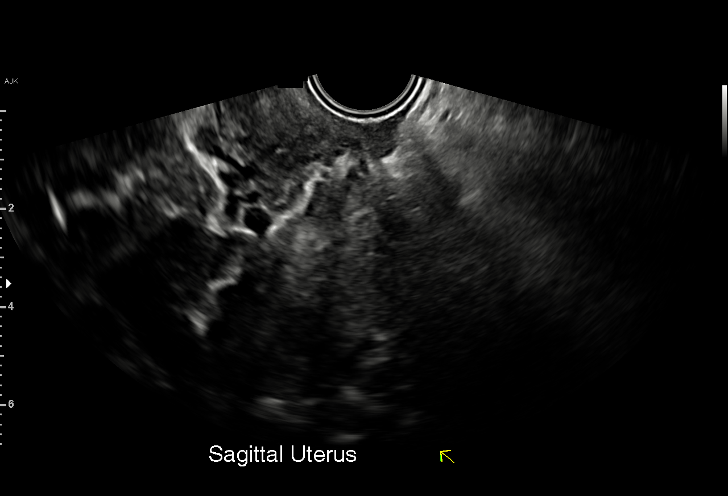
[im 39/71]
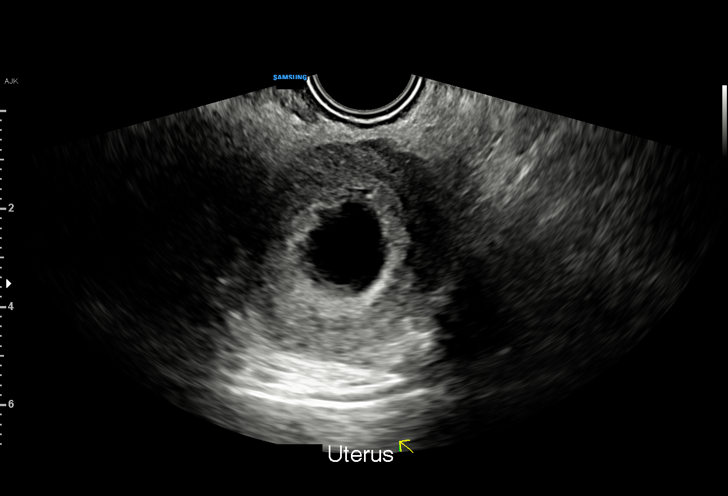
[im 45/71]
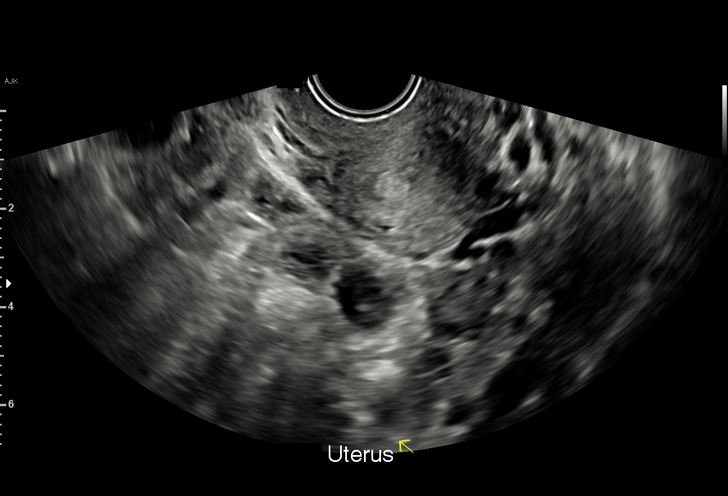
[im 50/71]
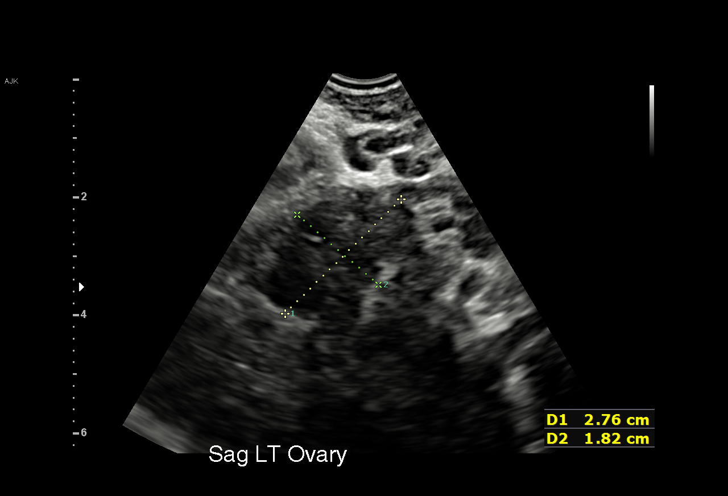
[im 55/71]
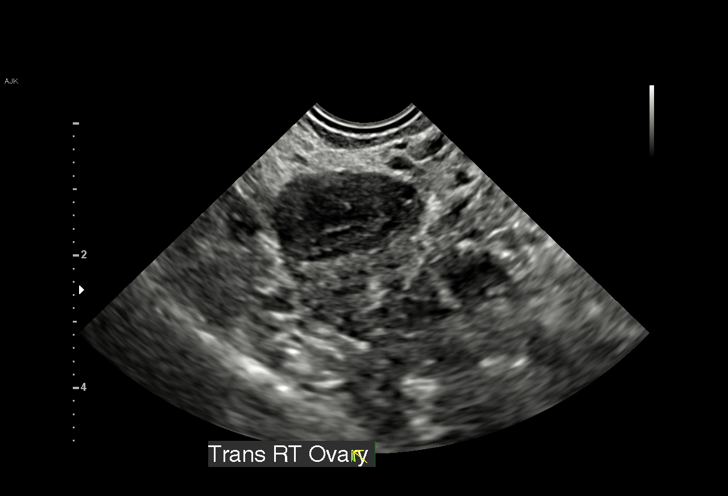
[im 60/71]
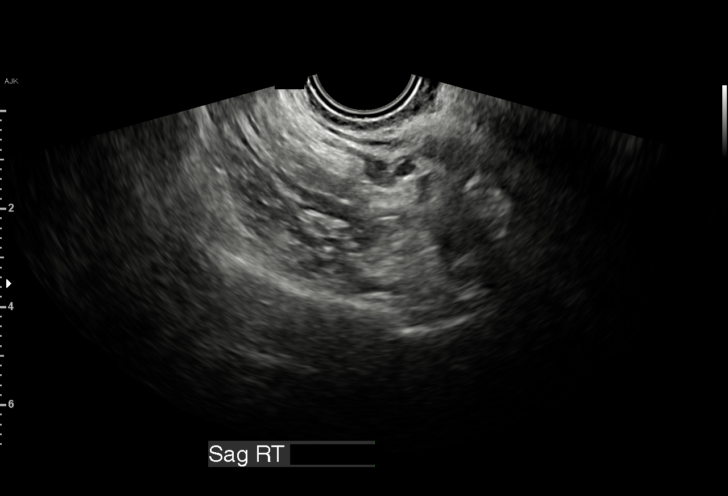
[im 65/71]
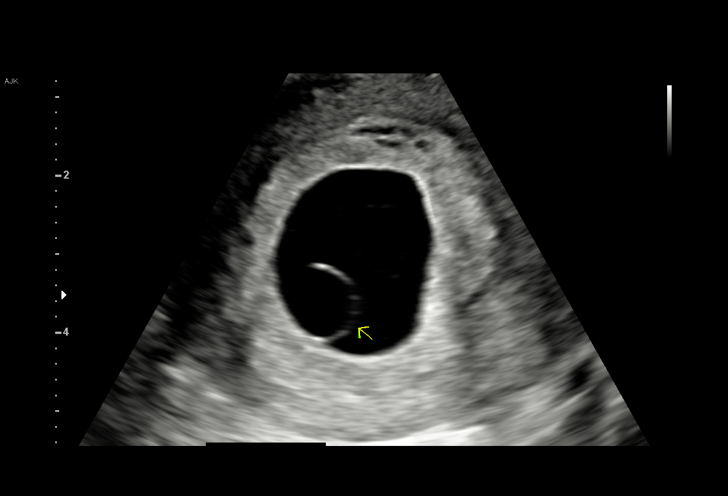
[im 71/71]
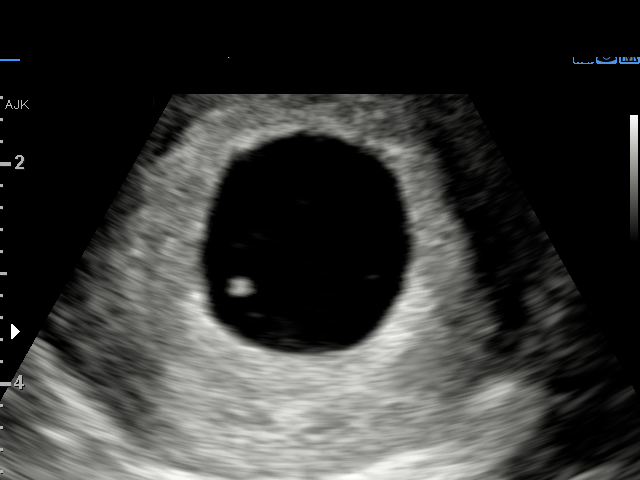

[15 of 28 positions shown; findings below may reference images not displayed]

FINDINGS: Intrauterine gestational sac: Single

Yolk sac: Visualized. The yolk sac is prominent in size measuring up
to 1.0 cm.

Embryo:  Visualized.

Cardiac Activity: Not Visualized.

Heart Rate: N/a bpm

CRL: 2.8 mm   5 w   5 d                  US EDC: 06/08/2022

Subchorionic hemorrhage:  None visualized.

Maternal uterus/adnexae: A corpus luteum is noted on the right. The
left ovary is unremarkable.
IMPRESSION: Findings are suspicious but not yet definitive for failed pregnancy.
Recommend follow-up US in 10-14 days for definitive diagnosis. This
recommendation follows SRU consensus guidelines: Diagnostic Criteria
for Nonviable Pregnancy Early in the First Trimester. N Engl J Med

## 2022-10-22 ENCOUNTER — Encounter: Payer: Medicaid Other | Admitting: Family Medicine

## 2022-10-22 ENCOUNTER — Encounter: Payer: Self-pay | Admitting: Family Medicine

## 2022-10-23 ENCOUNTER — Ambulatory Visit (INDEPENDENT_AMBULATORY_CARE_PROVIDER_SITE_OTHER): Payer: Medicaid Other

## 2022-10-23 VITALS — BP 104/71 | HR 75 | Wt 144.0 lb

## 2022-10-23 DIAGNOSIS — Z98891 History of uterine scar from previous surgery: Secondary | ICD-10-CM

## 2022-10-23 NOTE — Progress Notes (Signed)
ATTESTATION OF SUPERVISION OF RN: Evaluation and management procedures were performed by the RN under my supervision and collaboration. I have reviewed the nursing note and chart and agree with the management and plan for this patient.  Lorayne [place], CNM  

## 2022-10-23 NOTE — Progress Notes (Signed)
Subjective:     Susan Suarez is a 36 y.o. female who presents to the clinic 1 weeks status post low uterine, transverse cesarean section. Pt reports incision is healing well.      Objective:    There were no vitals taken for this visit. General:  alert, well appearing, in no apparent distress  Incision:   healing well, no drainage, no erythema, no hernia, no seroma, no swelling, no dehiscence, incision well approximated     Assessment:    Doing well postoperatively.   Plan:    1. Continue any current medications. 2. Wound care discussed. 3. Follow up: Post Partum Appt .   Courtney Heys, CMA

## 2022-10-27 ENCOUNTER — Ambulatory Visit (INDEPENDENT_AMBULATORY_CARE_PROVIDER_SITE_OTHER): Payer: Medicaid Other | Admitting: Licensed Clinical Social Worker

## 2022-10-27 DIAGNOSIS — F53 Postpartum depression: Secondary | ICD-10-CM | POA: Diagnosis not present

## 2022-10-27 NOTE — BH Specialist Note (Signed)
Integrated Behavioral Health via Telemedicine Visit  10/27/2022 Susan Suarez [id number]  Number of Integrated Behavioral Health Clinician visits: 1 Session Start time:  100pm Session End time: 124pm Total time in minutes: 24 mins via mychart   Referring Provider: Dr. Kennon Rounds  Patient/Family location: Home  [facility] Provider location: [place]  All persons participating in visit: Pt Susan Suarez and Susan Suarez  Types of Service: Individual psychotherapy and Video visit  I connected with Susan Suarez and/or Susan Suarez'Susan n/a via  Telephone or Geologist, engineering  (Video is Tree surgeon) and verified that I am speaking with the correct person using two identifiers. Discussed confidentiality: Yes   I discussed the limitations of telemedicine and the availability of in person appointments.  Discussed there is a possibility of technology failure and discussed alternative modes of communication if that failure occurs.  I discussed that engaging in this telemedicine visit, they consent to the provision of behavioral healthcare and the services will be billed under their insurance.  Patient and/or legal guardian expressed understanding and consented to Telemedicine visit: Yes   Presenting Concerns: Patient and/or family reports the following symptoms/concerns: postpartum depression Duration of problem: approx two months; Severity of problem: mild  Patient and/or Family'Susan Strengths/Protective Factors: Concrete supports in place (healthy food, safe environments, etc.)  Goals Addressed: Patient will:  Reduce symptoms of: depression   Increase knowledge and/or ability of: coping skills   Demonstrate ability to: Increase healthy adjustment to current life circumstances  Progress towards Goals: Ongoing  Interventions: Interventions utilized:  Supportive Counseling Standardized Assessments completed: PHQ 9  Patient and/or Family Response:  Susan Suarez reports symptoms associated with postpartum depression. Susan Suarez reports she is currently taking zoloft. Susan Suarez reports depressed mood, tearful, feelings of guilt  Assessment: Patient currently experiencing postpartum depression  Patient may benefit from community mental health.  Plan: Follow up with behavioral health clinician on : n/a Behavioral recommendations: Contact mental health provider referral, engage in self care, prioritize rest  Referral(Susan): [name] (In Clinic)  I discussed the assessment and treatment plan with the patient and/or parent/guardian. They were provided an opportunity to ask questions and all were answered. They agreed with the plan and demonstrated an understanding of the instructions.   They were advised to call back or seek an in-person evaluation if the symptoms worsen or if the condition fails to improve as anticipated.  Susan Ferrier, Susan

## 2022-10-28 ENCOUNTER — Telehealth: Payer: Self-pay

## 2022-10-28 ENCOUNTER — Other Ambulatory Visit: Payer: Self-pay | Admitting: Family Medicine

## 2022-10-28 DIAGNOSIS — F411 Generalized anxiety disorder: Secondary | ICD-10-CM

## 2022-10-28 DIAGNOSIS — F3342 Major depressive disorder, recurrent, in full remission: Secondary | ICD-10-CM

## 2022-10-28 MED ORDER — SERTRALINE HCL 100 MG PO TABS: 150.0000 mg | ORAL_TABLET | Freq: Every day | ORAL | 1 refills | Status: DC

## 2022-10-28 NOTE — Telephone Encounter (Signed)
TC from care management RN to make [place] aware pt scored 12 on EPDS. Pt had already reached out to a provider Rx was adjusted.

## 2022-10-28 NOTE — Anesthesia Postprocedure Evaluation (Signed)
Anesthesia Post Note  Patient: Susan Suarez  Procedure(s) Performed: CESAREAN SECTION     Patient location during evaluation: PACU Anesthesia Type: Spinal Level of consciousness: awake Pain management: pain level controlled Vital Signs Assessment: post-procedure vital signs reviewed and stable Respiratory status: spontaneous breathing Cardiovascular status: stable Postop Assessment: no headache, no backache, spinal receding, patient able to bend at knees and no apparent nausea or vomiting Anesthetic complications: no   No notable events documented.  Last Vitals:  Vitals:   10/13/22 2144 10/14/22 0551  BP: 125/69 109/69  Pulse: 78 81  Resp: 17 18  Temp: 37 C 36.6 C  SpO2: 100%     Last Pain:  Vitals:   10/14/22 1530  TempSrc:   PainSc: 0-No pain   Pain Goal: Patients Stated Pain Goal: 3 (10/13/22 1119)                 Huston Foley

## 2022-11-05 ENCOUNTER — Encounter: Payer: Medicaid Other | Admitting: Family Medicine

## 2022-11-26 ENCOUNTER — Ambulatory Visit: Payer: Medicaid Other | Admitting: Family Medicine

## 2022-11-26 ENCOUNTER — Ambulatory Visit: Payer: Medicaid Other

## 2022-11-26 ENCOUNTER — Ambulatory Visit: Payer: Medicaid Other | Admitting: Obstetrics & Gynecology

## 2022-11-26 ENCOUNTER — Other Ambulatory Visit: Payer: Medicaid Other

## 2022-12-05 ENCOUNTER — Encounter: Payer: Self-pay | Admitting: Family Medicine

## 2022-12-05 ENCOUNTER — Ambulatory Visit (INDEPENDENT_AMBULATORY_CARE_PROVIDER_SITE_OTHER): Payer: Medicaid Other | Admitting: Family Medicine

## 2022-12-05 ENCOUNTER — Other Ambulatory Visit: Payer: Medicaid Other

## 2022-12-05 VITALS — BP 108/70 | HR 78 | Wt 145.0 lb

## 2022-12-05 DIAGNOSIS — Z30011 Encounter for initial prescription of contraceptive pills: Secondary | ICD-10-CM

## 2022-12-05 DIAGNOSIS — O24419 Gestational diabetes mellitus in pregnancy, unspecified control: Secondary | ICD-10-CM

## 2022-12-05 MED ORDER — SLYND 4 MG PO TABS: 4.0000 mg | ORAL_TABLET | Freq: Every day | ORAL | 4 refills | Status: DC

## 2022-12-05 NOTE — Progress Notes (Addendum)
[place] Partum Visit Note  Susan Suarez is a 36 y.o. [phone] female who presents for a postpartum visit. She is 7 weeks postpartum following a repeat cesarean section.  I have fully reviewed the prenatal and intrapartum course. The delivery was at 37.0 gestational weeks.  Anesthesia: spinal. Postpartum course has been uncomplicated. Baby is doing well. Baby is feeding by bottle - Similac Alimentum. Bleeding  has started her cycle back . Bowel function is normal. Bladder function is normal. Patient is not sexually active. Contraception method is  undecided . Postpartum depression screening: negative  The pregnancy intention screening data noted above was reviewed. Potential methods of contraception were discussed. The patient elected to proceed with No data recorded.   Edinburgh Postnatal Depression Scale - 12/05/22 0820       Edinburgh Postnatal Depression Scale:  In the Past 7 Days   I have been able to laugh and see the funny side of things. 0    I have looked forward with enjoyment to things. 0    I have blamed myself unnecessarily when things went wrong. 1    I have been anxious or worried for no good reason. 2    I have felt scared or panicky for no good reason. 2    Things have been getting on top of me. 0    I have been so unhappy that I have had difficulty sleeping. 0    I have felt sad or miserable. 0    I have been so unhappy that I have been crying. 0    The thought of harming myself has occurred to me. 0    Edinburgh Postnatal Depression Scale Total 5             Health Maintenance Due  Topic Date Due   COVID-19 Vaccine (4 - 2023-24 season) 08/22/2022    The following portions of the patient's history were reviewed and updated as appropriate: allergies, current medications, past family history, past medical history, past social history, past surgical history, and problem list.  Review of Systems Pertinent items are noted in HPI.  Objective:  BP 108/70    Pulse 78   Wt 145 lb (65.8 kg)   Breastfeeding No   BMI 26.52 kg/m    General:  alert, cooperative, and appears stated age   Breasts:  not indicated  Lungs: clear to auscultation bilaterally  Heart:  regular rate and rhythm, S1, S2 normal, no murmur, click, rub or gallop  Abdomen: soft, non-tender; bowel sounds normal; no masses,  no organomegaly   Wound well approximated incision  GU exam:  not indicated       Assessment:  Normal postpartum exam.   Plan:   Essential components of care per ACOG recommendations:  1.  Mood and well being: Patient with negative depression screening today. Reviewed local resources for support.  - Patient tobacco use? No.   - hx of drug use? No.    2. Infant care and feeding:  -Patient currently breastmilk feeding? No.  -Social determinants of health (SDOH) reviewed in EPIC. No concerns  3. Sexuality, contraception and birth spacing - Patient does not want a pregnancy in the next year.  Desired family size is 2-3 children.  - Reviewed reproductive life planning. Reviewed contraceptive methods based on pt preferences and effectiveness.  Patient desired Oral Contraceptive today.   - Discussed birth spacing of 18 months  4. Sleep and fatigue -Encouraged family/partner/community support of 4 hrs of  uninterrupted sleep to help with mood and fatigue  5. Physical Recovery  - Discussed patients delivery and complications. She describes her labor as good. - Patient had a C-section repeat; no problems after deliver. laceration. Perineal healing reviewed. Patient expressed understanding - Patient has urinary incontinence? No. - Patient is safe to resume physical and sexual activity  6.  Health Maintenance - HM due items addressed Yes - Last pap smear  Diagnosis  Date Value Ref Range Status  04/16/2022   Final   - Negative for intraepithelial lesion or malignancy (NILM)   Pap smear not done at today's visit.  -Breast Cancer screening indicated? No.    7. Chronic Disease/Pregnancy Condition follow up: Gestational Diabetes  #A2GDM-- completing 2 hr today  1. Encounter for initial prescription of contraceptive pills - Drospirenone (SLYND) 4 MG TABS; Take 4 mg by mouth daily.  Dispense: 84 tablet; Refill: 4   - PCP follow up  Caren Macadam, [place] for [place], [place]

## 2022-12-06 LAB — GLUCOSE TOLERANCE, 2 HOURS
Glucose, 2 hour: 89 mg/dL (ref 70–139)
Glucose, GTT - Fasting: 103 mg/dL — ABNORMAL HIGH (ref 70–99)

## 2023-02-10 ENCOUNTER — Encounter (HOSPITAL_COMMUNITY): Payer: Self-pay | Admitting: Family Medicine

## 2023-04-10 ENCOUNTER — Encounter (HOSPITAL_COMMUNITY): Payer: Self-pay | Admitting: Family Medicine

## 2023-06-22 DIAGNOSIS — F909 Attention-deficit hyperactivity disorder, unspecified type: Secondary | ICD-10-CM | POA: Insufficient documentation

## 2023-07-22 ENCOUNTER — Telehealth: Payer: Self-pay

## 2023-07-22 ENCOUNTER — Other Ambulatory Visit: Payer: Self-pay | Admitting: Internal Medicine

## 2023-07-22 ENCOUNTER — Ambulatory Visit (INDEPENDENT_AMBULATORY_CARE_PROVIDER_SITE_OTHER): Payer: Medicaid Other | Admitting: Internal Medicine

## 2023-07-22 ENCOUNTER — Encounter: Payer: Self-pay | Admitting: Internal Medicine

## 2023-07-22 VITALS — BP 110/60 | HR 96 | Ht 62.0 in | Wt 140.0 lb

## 2023-07-22 DIAGNOSIS — F902 Attention-deficit hyperactivity disorder, combined type: Secondary | ICD-10-CM | POA: Diagnosis not present

## 2023-07-22 DIAGNOSIS — F322 Major depressive disorder, single episode, severe without psychotic features: Secondary | ICD-10-CM | POA: Diagnosis not present

## 2023-07-22 DIAGNOSIS — R7303 Prediabetes: Secondary | ICD-10-CM | POA: Insufficient documentation

## 2023-07-22 DIAGNOSIS — G542 Cervical root disorders, not elsewhere classified: Secondary | ICD-10-CM | POA: Diagnosis not present

## 2023-07-22 DIAGNOSIS — F411 Generalized anxiety disorder: Secondary | ICD-10-CM

## 2023-07-22 DIAGNOSIS — E7439 Other disorders of intestinal carbohydrate absorption: Secondary | ICD-10-CM

## 2023-07-22 NOTE — Telephone Encounter (Signed)
Called pt left VM to call back. Dr. Judithann Graves does not prescribe ADHD medication. Pt also goes to [facility]. We can see the patient today but we can not prescribe any ADHD medication we would have to place a referral for psych.  KP

## 2023-07-22 NOTE — Assessment & Plan Note (Signed)
Doing well with home exercise and stretching Sees chiropractor as needed Cymbalta has helped
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
## 2023-07-22 NOTE — Assessment & Plan Note (Signed)
Uses xanax PRN

## 2023-07-22 NOTE — Assessment & Plan Note (Signed)
Noted during recent pregnancy Will need to monitor to avoid progression to DM Return in 3 months for follow up and labs

## 2023-07-22 NOTE — Assessment & Plan Note (Signed)
Clinically stable on current regimen with fair control of symptoms, No SI or HI. Cymbalta has been beneficial for depression and neck pain. She has Xanax to use PRN and uses this rarely. No change in management at this time.

## 2023-07-22 NOTE — Assessment & Plan Note (Signed)
Recently treated with Ritalin and Concerta along with guafacine with some improvement Mood is still fairly labile.   Recommend Psych evaluation for ongoing treatment and medication management

## 2023-08-11 ENCOUNTER — Telehealth: Payer: Self-pay | Admitting: Psychiatry

## 2023-08-11 NOTE — Telephone Encounter (Signed)
Please also see if she needs to be referred to [facility] or IOP rather than waiting till October.

## 2023-08-11 NOTE — Telephone Encounter (Signed)
Please have nurse refer patient to IOP/PHP or schedule with some one sooner .

## 2023-08-11 NOTE — Telephone Encounter (Signed)
Received new patient paperwork on 08/11/23. Patient marked suicidal thoughts, Emailed patient and made aware the asterisks at bottom of paperwork indicates if current suicidal thoughts to go to the nearest emergency room or [facility] in [place]. She is on HIGH priority list for appointment sooner than 09/22/23

## 2023-08-12 ENCOUNTER — Telehealth (HOSPITAL_COMMUNITY): Payer: Self-pay | Admitting: Licensed Clinical Social Worker

## 2023-08-12 NOTE — Telephone Encounter (Signed)
Thank you :)

## 2023-08-12 NOTE — Telephone Encounter (Signed)
sent a email to [facility] to see if they can get with patient about this.

## 2023-08-20 ENCOUNTER — Ambulatory Visit (INDEPENDENT_AMBULATORY_CARE_PROVIDER_SITE_OTHER): Payer: Medicaid Other | Admitting: Psychiatry

## 2023-08-20 ENCOUNTER — Encounter: Payer: Self-pay | Admitting: Psychiatry

## 2023-08-20 ENCOUNTER — Other Ambulatory Visit
Admission: RE | Admit: 2023-08-20 | Discharge: 2023-08-20 | Disposition: A | Discharge status: Home / Self Care | Payer: Medicaid Other | Source: Ambulatory Visit | Attending: Psychiatry | Admitting: Psychiatry

## 2023-08-20 VITALS — BP 105/70 | HR 85 | Temp 97.5°F | Ht 62.0 in | Wt 140.4 lb

## 2023-08-20 DIAGNOSIS — Z79899 Other long term (current) drug therapy: Secondary | ICD-10-CM | POA: Insufficient documentation

## 2023-08-20 DIAGNOSIS — F1021 Alcohol dependence, in remission: Secondary | ICD-10-CM

## 2023-08-20 DIAGNOSIS — Z9189 Other specified personal risk factors, not elsewhere classified: Secondary | ICD-10-CM

## 2023-08-20 DIAGNOSIS — F3161 Bipolar disorder, current episode mixed, mild: Secondary | ICD-10-CM

## 2023-08-20 DIAGNOSIS — F411 Generalized anxiety disorder: Secondary | ICD-10-CM

## 2023-08-20 DIAGNOSIS — R41840 Attention and concentration deficit: Secondary | ICD-10-CM | POA: Insufficient documentation

## 2023-08-20 LAB — LIPID PANEL
Cholesterol: 175 mg/dL (ref 0–200)
HDL: 62 mg/dL (ref >40)
LDL Cholesterol: 103 mg/dL — ABNORMAL HIGH (ref 0–99)
Total CHOL/HDL Ratio: 2.8 RATIO
Triglycerides: 52 mg/dL (ref <150)
VLDL: 10 mg/dL (ref 0–40)

## 2023-08-20 LAB — COMPREHENSIVE METABOLIC PANEL
ALT: 12 U/L (ref 0–44)
AST: 15 U/L (ref 15–41)
Albumin: 4.2 g/dL (ref 3.5–5.0)
Alkaline Phosphatase: 57 U/L (ref 38–126)
Anion gap: 9 (ref 5–15)
BUN: 8 mg/dL (ref 6–20)
CO2: 25 mmol/L (ref 22–32)
Calcium: 8.9 mg/dL (ref 8.9–10.3)
Chloride: 104 mmol/L (ref 98–111)
Creatinine, Ser: 0.64 mg/dL (ref 0.44–1.00)
GFR, Estimated: >60 mL/min (ref >60)
Glucose, Bld: 105 mg/dL — ABNORMAL HIGH (ref 70–99)
Potassium: 3.5 mmol/L (ref 3.5–5.1)
Sodium: 138 mmol/L (ref 135–145)
Total Bilirubin: 0.7 mg/dL (ref 0.3–1.2)
Total Protein: 7.1 g/dL (ref 6.5–8.1)

## 2023-08-20 LAB — HEMOGLOBIN A1C
Hgb A1c MFr Bld: 5.7 % — ABNORMAL HIGH (ref 4.8–5.6)
Mean Plasma Glucose: 116.89 mg/dL

## 2023-08-20 LAB — TSH: TSH: 1.373 u[IU]/mL (ref 0.350–4.500)

## 2023-08-20 MED ORDER — HYDROXYZINE HCL 25 MG PO TABS
25.0000 mg | ORAL_TABLET | Freq: Three times a day (TID) | ORAL | 1 refills | Status: DC | PRN
Start: 2023-08-20 — End: 2023-09-11
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
## 2023-08-20 NOTE — Patient Instructions (Addendum)
[facility] Address: [address] [place], [place], [place] [address] Hours:  Open ? Closes 5?PM Phone: [phone]    Please call for EKG - [phone]  Please call [facility] at [phone] for DBT therapy .       Other therapist options are below :  [email or web address]  [email or web address]  [email or web address] [facility] [address], [place], [place] [address]   [phone]  [facility] [email or web address] [address], [place], [place] [address]  [phone]  [facility] [email or web address] [address], [place], [place] [address]  [phone]   [facility] - [phone]  [facility] - [phone]  [facility] - [phone] counseling [phone]  [facility] [phone]   [email or web address] this clinician can offer telehealth and has a sliding scale option  [email or web address] this group also offers sliding scale rates and is based out of [place]  Dr. Liborio Nixon with the [facility] specializes in divorce  [employer] [facility] has interns who offer sliding scale rates and some of the full time clinicians do, as well. You complete their contact form on their website and the referrals coordinator will help to get connected to someone   Medicaid below :  [facility] [address] [place], [place] [address]  [phone]    [employer] [address] [place], [place] [address]  [phone]    [facility] [address] [place], [place] [address]  [phone]      Quetiapine Tablets What is this medication? QUETIAPINE (kwe TYE a peen) treats schizophrenia and  bipolar disorder. It works by balancing the levels of dopamine and serotonin in your brain, hormones that help regulate mood, behaviors, and thoughts. It belongs to a group of medications called antipsychotics. Antipsychotic medications can be used to treat several kinds of mental health conditions. This medicine may be used for other purposes; ask your health care provider or pharmacist if you have questions. COMMON BRAND NAME(S): Seroquel What should I tell my care team before I take this medication? They need to know if you have any of these conditions: Blockage in your bowels Cataracts Constipation Dementia Diabetes Difficulty swallowing Glaucoma Heart disease High levels of prolactin History of breast cancer History of irregular heartbeat Liver disease Low blood cell levels (white cells, red cells, and platelets) Low blood pressure Parkinson disease Prostate disease Seizures Suicidal thoughts, plans, or attempt by you or a family member Thyroid disease Trouble passing urine An unusual or allergic reaction to quetiapine, other medications, foods, dyes, or preservatives Pregnant or trying to get pregnant Breastfeeding How should I use this medication? Take this medication by mouth with water. Take it as directed on the prescription label at the same time every day. You can take it with or without food. If it upsets your stomach, take it with food. Keep taking it unless your care team tells you to stop. A special MedGuide will be given to you by the pharmacist with each prescription and refill. Be sure to read this information carefully each time. Talk to your care team about the use of this medication in children. While this medication may be prescribed for children as young as 10 years for selected conditions, precautions do apply. People over 58 years of age 56 have  a stronger reaction to this medication and need smaller doses. Overdosage: If you think you have taken too much  of this medicine contact a poison control center or emergency room at once. NOTE: This medicine is only for you. Do not share this medicine with others. What if I miss a dose? If you miss a dose, take it as soon as you can. If it is almost time for your next dose, take only that dose. Do not take double or extra doses. What may interact with this medication? Do not take this medication with any of the following: Cisapride Dronedarone Metoclopramide Pimozide Thioridazine This medication may also interact with the following: Alcohol Antihistamines for allergy, cough, and cold Atropine Avasimibe Certain antivirals for HIV or hepatitis Certain medications for anxiety or sleep Certain medications for bladder problems, such as oxybutynin, tolterodine Certain medications for depression, such as amitriptyline, fluoxetine, nefazodone, sertraline Certain medications for fungal infections, such as fluconazole, ketoconazole, itraconazole, posaconazole Certain medications for stomach problems, such as dicyclomine, hyoscyamine Certain medications for travel sickness, such as scopolamine Cimetidine General anesthetics, such as halothane, isoflurane, methoxyflurane, propofol Ipratropium Levodopa or other medications for Parkinson disease Medications for blood pressure Medications for seizures Medications that relax muscles for surgery Opioid medications for pain Other medications that cause heart rhythm changes Phenothiazines, such as chlorpromazine, prochlorperazine Rifampin St. John's wort This list may not describe all possible interactions. Give your health care provider a list of all the medicines, herbs, non-prescription drugs, or dietary supplements you use. Also tell them if you smoke, drink alcohol, or use illegal drugs. Some items may interact with your medicine. What should I watch for while using this medication? Visit your care team for regular checks on your progress. Tell your  care team if your symptoms do not start to get better or if they get worse. Do not suddenly stop taking This medication. You may develop a severe reaction. Your care team will tell you how much medication to take. If your care team wants you to stop the medication, the dose may be slowly lowered over time to avoid any side effects. You may need to have an eye exam before and during use of this medication. This medication may increase blood sugar. Ask your care team if changes in diet or medications are needed if you have diabetes. This medication may cause thoughts of suicide or depression. This includes sudden changes in mood, behaviors, or thoughts. These changes can happen at any time but are more common in the beginning of treatment or after a change in dose. Call your care team right away if you experience these thoughts or worsening depression. This medication may affect your coordination, reaction time, or judgment. Do not drive or operate machinery until you know how this medication affects you. Sit up or stand slowly to reduce the risk of dizzy or fainting spells. Drinking alcohol with this medication can increase the risk of these side effects. This medication can cause problems with controlling your body temperature. It can lower the response of your body to cold temperatures. If possible, stay indoors during cold weather. If you must go outdoors, wear warm clothes. It can also lower the response of your body to heat. Do not overheat. Do not over-exercise. Stay out of the sun when possible. If you must be in the sun, wear cool clothing. Drink plenty of water. If you have trouble controlling your body temperature, call your care team right away. What side effects may I notice from  receiving this medication? Side effects that you should report to your care team as soon as possible: Allergic reactions--skin rash, itching, hives, swelling of the face, lips, tongue, or throat Heart rhythm  changes--fast or irregular heartbeat, dizziness, feeling faint or lightheaded, chest pain, trouble breathing High blood sugar (hyperglycemia)--increased thirst or amount of urine, unusual weakness or fatigue, blurry vision High fever, stiff muscles, increased sweating, fast or irregular heartbeat, and confusion, which may be signs of neuroleptic malignant syndrome High prolactin level--unexpected breast tissue growth, discharge from the nipple, change in sex drive or performance, irregular menstrual cycle Increase in blood pressure in children Infection--fever, chills, cough, or sore throat Low blood pressure--dizziness, feeling faint or lightheaded, blurry vision Low thyroid levels (hypothyroidism)--unusual weakness or fatigue, increased sensitivity to cold, constipation, hair loss, dry skin, weight gain, feelings of depression Pain or trouble swallowing Seizures Stroke--sudden numbness or weakness of the face, arm, or leg, trouble speaking, confusion, trouble walking, loss of balance or coordination, dizziness, severe headache, change in vision Sudden eye pain or change in vision such as blurry vision, seeing halos around lights, vision loss Thoughts of suicide or self-harm, worsening mood, feelings of depression Trouble passing urine Uncontrolled and repetitive body movements, muscle stiffness or spasms, tremors or shaking, loss of balance or coordination, restlessness, shuffling walk, which may be signs of extrapyramidal symptoms (EPS) Side effects that usually do not require medical attention (report to your care team if they continue or are bothersome): Constipation Dizziness Drowsiness Dry mouth Weight gain This list may not describe all possible side effects. Call your doctor for medical advice about side effects. You may report side effects to FDA at 1-800-FDA-1088. Where should I keep my medication? Keep out of the reach of children. Store at room temperature between 15 and 30  degrees C (59 and 86 degrees F). Throw away any unused medication after the expiration date. NOTE: This sheet is a summary. It may not cover all possible information. If you have questions about this medicine, talk to your doctor, pharmacist, or health care provider.  2024 Elsevier/Gold Standard (2022-06-23 00:00:00)

## 2023-08-21 ENCOUNTER — Ambulatory Visit
Admission: RE | Admit: 2023-08-21 | Discharge: 2023-08-21 | Disposition: A | Discharge status: Home / Self Care | Payer: Medicaid Other | Source: Ambulatory Visit | Attending: Psychiatry | Admitting: Psychiatry

## 2023-08-21 ENCOUNTER — Encounter: Payer: Self-pay | Admitting: Psychiatry

## 2023-08-21 DIAGNOSIS — Z9189 Other specified personal risk factors, not elsewhere classified: Secondary | ICD-10-CM | POA: Diagnosis present

## 2023-08-25 LAB — DRUG PROFILE 799031
BENZODIAZEPINES: POSITIVE — AB
HYDROXYALPRAZOLAM: POSITIVE — AB
NORDIAZEPAM: NEGATIVE
OH-Alprazolam GC/MS Conf: 920 ng/mL
Oxazepam: NEGATIVE

## 2023-08-25 LAB — URINE DRUGS OF ABUSE SCREEN W ALC, ROUTINE (REF LAB)
Amphetamines, Urine: NEGATIVE ng/mL
Barbiturate, Ur: NEGATIVE ng/mL
Cannabinoid Quant, Ur: NEGATIVE ng/mL
Cocaine (Metab.): NEGATIVE ng/mL
Ethanol U, Quan: NEGATIVE %
Methadone Screen, Urine: NEGATIVE ng/mL
Opiate Quant, Ur: NEGATIVE ng/mL
Phencyclidine, Ur: NEGATIVE ng/mL
Propoxyphene, Urine: NEGATIVE ng/mL

## 2023-08-26 ENCOUNTER — Telehealth: Payer: Self-pay | Admitting: Psychiatry

## 2023-08-26 DIAGNOSIS — F3161 Bipolar disorder, current episode mixed, mild: Secondary | ICD-10-CM

## 2023-08-26 MED ORDER — QUETIAPINE FUMARATE 25 MG PO TABS: 25.0000 mg | ORAL_TABLET | Freq: Every day | ORAL | 0 refills | Status: DC

## 2023-08-26 NOTE — Telephone Encounter (Signed)
Contacted patient to discuss labs including urine drug screen, recent EKG.  Okay to start Seroquel as discussed.  Will send Seroquel 25 mg at bedtime to pharmacy.  Patient aware of side effects including weight gain, tardive dyskinesia, cardiac effect, metabolic syndrome and interaction with her other medications including Concerta, duloxetine, Ritalin-serotonin syndrome.

## 2023-08-30 ENCOUNTER — Encounter (INDEPENDENT_AMBULATORY_CARE_PROVIDER_SITE_OTHER): Payer: Medicaid Other

## 2023-08-30 DIAGNOSIS — F411 Generalized anxiety disorder: Secondary | ICD-10-CM | POA: Diagnosis not present

## 2023-08-30 DIAGNOSIS — F3161 Bipolar disorder, current episode mixed, mild: Secondary | ICD-10-CM | POA: Diagnosis not present

## 2023-09-10 MED ORDER — QUETIAPINE FUMARATE 50 MG PO TABS: 50.0000 mg | ORAL_TABLET | Freq: Every day | ORAL | 1 refills | Status: DC

## 2023-09-10 NOTE — Telephone Encounter (Signed)
Patient with bipolar disorder, generalized anxiety disorder, continues to struggle with mood symptoms, inability to function at work, will benefit from dosage increase of Seroquel to 50 mg. Patient may benefit from addition of another mood stabilizer since she continues to feel overwhelmed. Patient advised to schedule a sooner appointment for further management.   I have spent at least 6 minutes non face to face with patient today .

## 2023-09-11 ENCOUNTER — Other Ambulatory Visit: Payer: Self-pay | Admitting: Psychiatry

## 2023-09-11 DIAGNOSIS — F3161 Bipolar disorder, current episode mixed, mild: Secondary | ICD-10-CM

## 2023-09-11 DIAGNOSIS — F411 Generalized anxiety disorder: Secondary | ICD-10-CM

## 2023-09-15 ENCOUNTER — Ambulatory Visit
Admission: EM | Admit: 2023-09-15 | Discharge: 2023-09-15 | Disposition: A | Discharge status: Home / Self Care | Payer: Medicaid Other | Attending: Emergency Medicine | Admitting: Emergency Medicine

## 2023-09-15 ENCOUNTER — Ambulatory Visit: Payer: Medicaid Other | Admitting: Internal Medicine

## 2023-09-15 DIAGNOSIS — Z1152 Encounter for screening for COVID-19: Secondary | ICD-10-CM | POA: Diagnosis not present

## 2023-09-15 DIAGNOSIS — B349 Viral infection, unspecified: Secondary | ICD-10-CM | POA: Diagnosis present

## 2023-09-15 LAB — POCT RAPID STREP A (OFFICE): Rapid Strep A Screen: NEGATIVE

## 2023-09-15 NOTE — ED Triage Notes (Signed)
Patient to Urgent Care with complaints of sore throat, dry cough, fevers (max temp 99.6)/ generalized body aches.  Symptoms started two days ago.  Has been taking tylenol, last dose 3am.

## 2023-09-15 NOTE — Discharge Instructions (Addendum)
Your symptoms today are most likely being caused by a virus and should steadily improve in time it can take up to 7 to 10 days before you truly start to see a turnaround however things will get better  Strep testing is negative  COVID test is pending up to 24 hours, you will be notified of positive test results only, if positive he will need to quarantine until without fever for 24 hours, if no fever you may continue activity wearing mask until all symptoms have gone away    You can take Tylenol and/or Ibuprofen as needed for fever reduction and pain relief.   For cough: honey 1/2 to 1 teaspoon (you can dilute the honey in water or another fluid).  You can also use guaifenesin and dextromethorphan for cough. You can use a humidifier for chest congestion and cough.  If you don't have a humidifier, you can sit in the bathroom with the hot shower running.      For sore throat: try warm salt water gargles, cepacol lozenges, throat spray, warm tea or water with lemon/honey, popsicles or ice, or OTC cold relief medicine for throat discomfort.   For congestion: take a daily anti-histamine like Zyrtec, Claritin, and a oral decongestant, such as pseudoephedrine.  You can also use Flonase 1-2 sprays in each nostril daily.   It is important to stay hydrated: drink plenty of fluids (water, gatorade/powerade/pedialyte, juices, or teas) to keep your throat moisturized and help further relieve irritation/discomfort.

## 2023-09-15 NOTE — ED Provider Notes (Signed)
Susan Suarez    CSN: [id number] Arrival date & time: 09/15/23  0801      History   Chief Complaint Chief Complaint  Patient presents with   Cough   Fever   Sore Throat    HPI Susan Suarez is a 37 y.o. female.   Patient presents for evaluation of fever, body aches, mild nasal congestion, sore throat and a nonproductive cough present for 2 days.  Fever peaking at 99.6, endorses her temperature typically stays around 97.  Decreased appetite but tolerating some food and liquids.  No known sick contacts prior.  Denies respiratory history, non-smoker.  Past Medical History:  Diagnosis Date   ADHD    Allergy    Seasonal and surgical cleanser.   Anemia 2020   First Pregnancy   Anxiety    Depression    GDM, class A2 08/14/2022   Gestational diabetes     Patient Active Problem List   Diagnosis Date Noted   History of alcoholism (HCC) 08/20/2023   Attention and concentration deficit 08/20/2023   Bipolar 1 disorder, mixed, mild (HCC) 08/20/2023   High risk medication use 08/20/2023   At risk for prolonged QT interval syndrome 08/20/2023   Attention deficit hyperactivity disorder (ADHD), combined type 07/22/2023   Current severe episode of major depressive disorder without psychotic features without prior episode (HCC) 07/22/2023   Glucose intolerance 07/22/2023   History of actinic keratosis 04/16/2022   Previous Classical Cesarean Section 04/16/2022   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 10/30/2021   Rosacea 05/14/2021   Cervical neuropathy 04/07/2016   GAD (generalized anxiety disorder) 04/07/2016    Past Surgical History:  Procedure Laterality Date   CESAREAN SECTION     CESAREAN SECTION N/A 10/12/2022   Procedure: CESAREAN SECTION;  Surgeon: Reva Bores, MD;  Location: [facility] LD ORS;  Service: Obstetrics;  Laterality: N/A;   COSMETIC SURGERY  2006   Mole removal on nose   WISDOM TOOTH EXTRACTION       OB History     Gravida  5   Para  2   Term  2   Preterm      AB  3   Living  2      SAB  3   IAB      Ectopic      Multiple  0   Live Births  2            Home Medications    Prior to Admission medications   Medication Sig Start Date End Date Taking? Authorizing Provider  ALPRAZolam Prudy Feeler) 0.5 MG tablet Take by mouth. 04/22/23 06/11/24  [provider]  CONCERTA 18 MG CR tablet Take by mouth daily. 06/29/23   [provider]  Drospirenone (SLYND) 4 MG TABS Take 4 mg by mouth daily. 12/05/22   Federico Flake, MD  DULoxetine (CYMBALTA) 60 MG capsule Take 60 mg by mouth daily.    [provider]  fexofenadine (ALLEGRA) 180 MG tablet Take 180 mg by mouth daily as needed for allergies.    [provider]  fluticasone (FLONASE) 50 MCG/ACT nasal spray Place into both nostrils as needed. 04/24/21   [provider]  guanFACINE (INTUNIV) 1 MG TB24 ER tablet Take 1-2 mg by mouth at bedtime. 06/12/23   [provider]  hydrOXYzine (ATARAX) 25 MG tablet TAKE 1 TABLET BY MOUTH 3 TIMES DAILY AS NEEDED FOR ANXIETY. 09/11/23  Jomarie Longs, MD  methylphenidate (RITALIN) 10 MG tablet Take 10 mg by mouth 2 (two) times daily. 06/12/23   [provider]  Multiple Vitamin (MULTIVITAMIN) tablet Take 1 tablet by mouth as needed. Patient not taking: Reported on 08/20/2023    [provider]  OVER THE COUNTER MEDICATION once. [employer]    [provider]  QUEtiapine (SEROQUEL) 50 MG tablet Take 1 tablet (50 mg total) by mouth at bedtime. 09/10/23   Jomarie Longs, MD    Family History Family History  Problem Relation Age of Onset   Arthritis Mother    Varicose Veins Mother    Alcohol abuse Father    Bipolar disorder Father    Diabetes Father    ADD / ADHD Father    Depression Father    Intellectual disability Sister    Varicose Veins Sister    Learning disabilities Sister     Cancer Maternal Aunt        cervical   Obesity Maternal Aunt    Varicose Veins Maternal Aunt    Cancer Maternal Uncle    Cancer Paternal Uncle    Hypertension Maternal Grandfather    Stroke Maternal Grandfather    Hypertension Maternal Grandmother    Heart disease Maternal Grandmother    Varicose Veins Maternal Grandmother    Bipolar disorder Paternal Grandfather    Hypertension Paternal Grandfather    Diabetes Paternal Grandfather    Heart disease Paternal Grandfather    Hypertension Paternal Grandmother    Bipolar disorder Other    Asthma Neg Hx     Social History Social History   Tobacco Use   Smoking status: Former    Current packs/day: 0.00    Average packs/day: 1.5 packs/day for 15.0 years (22.5 ttl pk-yrs)    Types: Cigarettes    Quit date: 09/21/2018    Years since quitting: 4.9   Smokeless tobacco: Never  Vaping Use   Vaping status: Never Used  Substance Use Topics   Alcohol use: Yes    Alcohol/week: 3.0 - 5.0 standard drinks of alcohol    Types: 3 - 5 Glasses of wine per week   Drug use: Not Currently     Allergies   Chlorhexidine gluconate   Review of Systems Review of Systems   Physical Exam Triage Vital Signs ED Triage Vitals  Encounter Vitals Group     BP 09/15/23 0819 97/67     Systolic BP Percentile --      Diastolic BP Percentile --      Pulse Rate 09/15/23 0816 82     Resp 09/15/23 0816 18     Temp 09/15/23 0816 97.8 F (36.6 C)     Temp src --      SpO2 09/15/23 0816 97 %     Weight 09/15/23 0815 140 lb (63.5 kg)     Height 09/15/23 0815 5\' 2"  (1.575 m)     Head Circumference --      Peak Flow --      Pain Score 09/15/23 0813 6     Pain Loc --      Pain Education --      Exclude from Growth Chart --    No data found.  Updated Vital Signs BP 97/67   Pulse 82   Temp 97.8 F (36.6 C)   Resp 18   Ht 5\' 2"  (1.575 m)   Wt 140 lb (63.5 kg)   SpO2 97%   BMI 25.61 kg/m  Visual Acuity Right Eye Distance:   Left Eye  Distance:   Bilateral Distance:    Right Eye Near:   Left Eye Near:    Bilateral Near:     Physical Exam Constitutional:      Appearance: Normal appearance.  HENT:     Right Ear: Tympanic membrane, ear canal and external ear normal.     Left Ear: Tympanic membrane, ear canal and external ear normal.     Nose: Congestion present. No rhinorrhea.     Mouth/Throat:     Mouth: Mucous membranes are moist.     Pharynx: Posterior oropharyngeal erythema present.     Tonsils: Tonsillar exudate present. 0 on the right. 0 on the left.  Eyes:     Extraocular Movements: Extraocular movements intact.  Cardiovascular:     Rate and Rhythm: Normal rate and regular rhythm.     Pulses: Normal pulses.     Heart sounds: Normal heart sounds.  Pulmonary:     Effort: Pulmonary effort is normal.     Breath sounds: Normal breath sounds.  Musculoskeletal:     Cervical back: Normal range of motion and neck supple.  Skin:    General: Skin is warm and dry.  Neurological:     Mental Status: She is alert and oriented to person, place, and time. Mental status is at baseline.      UC Treatments / Results  Labs (all labs ordered are listed, but only abnormal results are displayed) Labs Reviewed  SARS CORONAVIRUS 2 (TAT 6-24 HRS)  POCT RAPID STREP A (OFFICE)    EKG   Radiology No results found.  Procedures Procedures (including critical care time)  Medications Ordered in UC Medications - No data to display  Initial Impression / Assessment and Plan / UC Course  I have reviewed the triage vital signs and the nursing notes.  Pertinent labs & imaging results that were available during my care of the patient were reviewed by me and considered in my medical decision making (see chart for details).  Viral illness  Patient is in no signs of distress nor toxic appearing.  Vital signs are stable.  Low suspicion for pneumonia, pneumothorax or bronchitis and therefore will defer imaging.  Rapid strep  test negative.COVID test is pending, reviewed quarantine guidelines per CDC recommendations healthy adult with minimal comorbidity, does not qualify for antivirals.May use additional over-the-counter medications as needed for supportive care.  May follow-up with urgent care as needed if symptoms persist or worsen.  Note given.   Final Clinical Impressions(s) / UC Diagnoses   Final diagnoses:  Viral illness     Discharge Instructions      Your symptoms today are most likely being caused by a virus and should steadily improve in time it can take up to 7 to 10 days before you truly start to see a turnaround however things will get better  Strep testing is negative  COVID test is pending up to 24 hours, you will be notified of positive test results only, if positive he will need to quarantine until without fever for 24 hours, if no fever you may continue activity wearing mask until all symptoms have gone away    You can take Tylenol and/or Ibuprofen as needed for fever reduction and pain relief.   For cough: honey 1/2 to 1 teaspoon (you can dilute the honey in water or another fluid).  You can also use guaifenesin and dextromethorphan for cough. You can use a humidifier  for chest congestion and cough.  If you don't have a humidifier, you can sit in the bathroom with the hot shower running.      For sore throat: try warm salt water gargles, cepacol lozenges, throat spray, warm tea or water with lemon/honey, popsicles or ice, or OTC cold relief medicine for throat discomfort.   For congestion: take a daily anti-histamine like Zyrtec, Claritin, and a oral decongestant, such as pseudoephedrine.  You can also use Flonase 1-2 sprays in each nostril daily.   It is important to stay hydrated: drink plenty of fluids (water, gatorade/powerade/pedialyte, juices, or teas) to keep your throat moisturized and help further relieve irritation/discomfort.    ED Prescriptions   None    PDMP not reviewed  this encounter.   Valinda Hoar, [place] 09/15/23 [phone]

## 2023-09-16 LAB — SARS CORONAVIRUS 2 (TAT 6-24 HRS): SARS Coronavirus 2: NEGATIVE

## 2023-09-22 ENCOUNTER — Ambulatory Visit: Payer: Medicaid Other | Admitting: Psychiatry

## 2023-09-25 ENCOUNTER — Telehealth (INDEPENDENT_AMBULATORY_CARE_PROVIDER_SITE_OTHER): Payer: Medicaid Other | Admitting: Psychiatry

## 2023-09-25 DIAGNOSIS — F3161 Bipolar disorder, current episode mixed, mild: Secondary | ICD-10-CM

## 2023-09-25 NOTE — Progress Notes (Signed)
Patient ID: Susan Suarez, female   DOB: [birth year], 37 y.o.   MRN: [id number]  Patient connected at the time of visit however was [place] of [place] and hence unable to complete this telemedicine visit.  Patient agrees to call back to reschedule the appointment.  Patient declines need for any refills at this time.  She will reach out for refills as needed.  I have communicated with staff.

## 2023-09-25 NOTE — Progress Notes (Deleted)
error 

## 2023-10-22 ENCOUNTER — Telehealth: Payer: Medicaid Other | Admitting: Psychiatry

## 2023-10-22 ENCOUNTER — Encounter: Payer: Self-pay | Admitting: Psychiatry

## 2023-10-22 DIAGNOSIS — Z79899 Other long term (current) drug therapy: Secondary | ICD-10-CM

## 2023-10-22 DIAGNOSIS — Z9189 Other specified personal risk factors, not elsewhere classified: Secondary | ICD-10-CM

## 2023-10-22 DIAGNOSIS — F411 Generalized anxiety disorder: Secondary | ICD-10-CM | POA: Diagnosis not present

## 2023-10-22 DIAGNOSIS — F3161 Bipolar disorder, current episode mixed, mild: Secondary | ICD-10-CM

## 2023-10-22 DIAGNOSIS — F1021 Alcohol dependence, in remission: Secondary | ICD-10-CM

## 2023-10-22 DIAGNOSIS — R41840 Attention and concentration deficit: Secondary | ICD-10-CM | POA: Diagnosis not present

## 2023-10-22 MED ORDER — QUETIAPINE FUMARATE 50 MG PO TABS
75.0000 mg | ORAL_TABLET | Freq: Every day | ORAL | 1 refills | Status: DC
Start: 2023-10-22 — End: 2023-11-13

## 2023-10-22 NOTE — Progress Notes (Signed)
Virtual Visit via Video Note  I connected with Susan Suarez on 10/22/23 at  1:00 PM EDT by a video enabled telemedicine application and verified that I am speaking with the correct person using two identifiers.  Location Provider Location : [facility] Patient Location : Home  Participants: Patient , Provider   I discussed the limitations of evaluation and management by telemedicine and the availability of in person appointments. The patient expressed understanding and agreed to proceed.   I discussed the assessment and treatment plan with the patient. The patient was provided an opportunity to ask questions and all were answered. The patient agreed with the plan and demonstrated an understanding of the instructions.   The patient was advised to call back or seek an in-person evaluation if the symptoms worsen or if the condition fails to improve as anticipated.   BH MD OP Progress Note  10/23/2023 12:27 PM Susan Suarez  MRN:  [id number]  Chief Complaint:  Chief Complaint  Patient presents with   Follow-up   Anxiety   Depression   Medication Refill   HPI: Susan Suarez is a 37 year old Caucasian female who is married, lives in [place], has a history of bipolar disorder, GAD, attention and concentration deficit, back pain was evaluated by telemedicine today.  Patient's last visit was on 08/20/2023.  Patient today returns reporting improvement in her mood symptoms.  Patient reports she has noticed a good difference with her depression symptoms.  She also believes anxiety is more under control.  She does continue to struggle with concentration as well as sleep issues.  She reports that Seroquel 50 mg initially helped her to sleep better.  However since the past few weeks she has noticed sleep is interrupted again.  She reports since she is at a new job and is also taking care of a lot of household chores she feels all over the place and struggles with her concentration.   She reports she attended a seminar recently and was able to read a book about ADHD.  She has been trying to follow some strategies from the book and that has definitely helped.  She has never had a chance to get ADHD testing as reported last visit.  She reports she did not call [place] [facility].  She does not believe she received a call from them either.  Patient reports she enjoys her new job.  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  Patient reports her family is more supportive.  Patient reports she is motivated to start therapy and will start calling some places.  She denies any suicidality, homicidality or perceptual disturbances.  Patient denies any other concerns today.  Visit Diagnosis:    ICD-10-CM   1. Bipolar 1 disorder, mixed, mild (HCC)  F31.61 QUEtiapine (SEROQUEL) 50 MG tablet    2. GAD (generalized anxiety disorder)  F41.1     3. History of alcoholism (HCC)  F10.21     4. Attention and concentration deficit  R41.840     5. High risk medication use  Z79.899     6. At risk for prolonged QT interval syndrome  Z91.89       Past Psychiatric History: I have reviewed past psychiatric history from progress note on 08/20/2023.  Past trials of Zoloft, Ritalin, Concerta, Intuniv, duloxetine, Xanax.  Past Medical History:  Past Medical History:  Diagnosis Date   ADHD    Allergy    Seasonal and surgical cleanser.   Anemia 2020  First Pregnancy   Anxiety    Depression    GDM, class A2 08/14/2022   Gestational diabetes     Past Surgical History:  Procedure Laterality Date   CESAREAN SECTION     CESAREAN SECTION N/A 10/12/2022   Procedure: CESAREAN SECTION;  Surgeon: Reva Bores, MD;  Location: [facility];  Service: Obstetrics;  Laterality: N/A;   COSMETIC SURGERY  2006   Mole removal on nose   WISDOM TOOTH EXTRACTION      Family Psychiatric History: I have reviewed family psychiatric history from progress note on 08/20/2023.  Family History:   Family History  Problem Relation Age of Onset   Arthritis Mother    Varicose Veins Mother    Alcohol abuse Father    Bipolar disorder Father    Diabetes Father    ADD / ADHD Father    Depression Father    Intellectual disability Sister    Varicose Veins Sister    Learning disabilities Sister    Cancer Maternal Aunt        cervical   Obesity Maternal Aunt    Varicose Veins Maternal Aunt    Cancer Maternal Uncle    Cancer Paternal Uncle    Hypertension Maternal Grandfather    Stroke Maternal Grandfather    Hypertension Maternal Grandmother    Heart disease Maternal Grandmother    Varicose Veins Maternal Grandmother    Bipolar disorder Paternal Grandfather    Hypertension Paternal Grandfather    Diabetes Paternal Grandfather    Heart disease Paternal Grandfather    Hypertension Paternal Grandmother    Bipolar disorder Other    Asthma Neg Hx     Social History: I have reviewed social history from progress note on 08/20/2023. Social History   Socioeconomic History   Marital status: Married    Spouse name: Not on file   Number of children: 2   Years of education: Not on file   Highest education level: Not on file  Occupational History   Not on file  Tobacco Use   Smoking status: Former    Current packs/day: 0.00    Average packs/day: 1.5 packs/day for 15.0 years (22.5 ttl pk-yrs)    Types: Cigarettes    Quit date: 09/21/2018    Years since quitting: 5.0   Smokeless tobacco: Never  Vaping Use   Vaping status: Never Used  Substance and Sexual Activity   Alcohol use: Yes    Alcohol/week: 3.0 - 5.0 standard drinks of alcohol    Types: 3 - 5 Glasses of wine per week   Drug use: Not Currently   Sexual activity: Yes    Birth control/protection: Pill  Other Topics Concern   Not on file  Social History Narrative   Not on file   Social Determinants of Health   Financial Resource Strain: Low Risk  (05/15/2020)   [facility] from [facility] visits  prior to 02/21/2023., [facility] visits prior to 02/21/2023.   Overall Financial Resource Strain (CARDIA)    Difficulty of Paying Living Expenses: Not very hard  Food Insecurity: Low Risk  (07/17/2023)   [facility]   Hunger Vital Sign    Worried About Running Out of Food in the Last Year: Never true    Ran Out of Food in the Last Year: Never true  Transportation Needs: Not on file (07/17/2023)  Physical Activity: Inactive (05/15/2020)   Received from [facility]  visits prior to 02/21/2023., [facility] visits prior to 02/21/2023.   Exercise Vital Sign    Days of Exercise per Week: 0 days    Minutes of Exercise per Session: 0 min  Stress: Stress Concern Present (05/15/2020)   Received from [facility] visits prior to 02/21/2023., [facility] visits prior to 02/21/2023.   [employer] of Occupational Health - Occupational Stress Questionnaire    Feeling of Stress : Very much  Social Connections: Moderately Isolated (05/15/2020)   Received from [facility] visits prior to 02/21/2023., [facility] visits prior to 02/21/2023.   Social Advertising account executive [NHANES]    Frequency of Communication with Friends and Family: More than three times a week    Frequency of Social Gatherings with Friends and Family: Once a week    Attends Religious Services: Never    Database administrator or Organizations: No    Attends Banker Meetings: Never    Marital Status: Married    Allergies:  Allergies  Allergen Reactions   Chlorhexidine Gluconate Rash    wash    Metabolic Disorder Labs: Lab Results  Component Value Date   HGBA1C 5.7 (H) 08/20/2023   MPG 116.89 08/20/2023   No results found for: "PROLACTIN" Lab Results  Component Value Date   CHOL 175 08/20/2023   TRIG 52 08/20/2023   HDL 62 08/20/2023   CHOLHDL 2.8  08/20/2023   VLDL 10 08/20/2023   LDLCALC 103 (H) 08/20/2023   Lab Results  Component Value Date   TSH 1.373 08/20/2023   TSH 0.763 10/30/2021    Therapeutic Level Labs: No results found for: "LITHIUM" No results found for: "VALPROATE" No results found for: "CBMZ"  Current Medications: Current Outpatient Medications  Medication Sig Dispense Refill   Drospirenone (SLYND) 4 MG TABS Take 4 mg by mouth daily. 84 tablet 4   DULoxetine (CYMBALTA) 60 MG capsule Take 60 mg by mouth daily.     fexofenadine (ALLEGRA) 180 MG tablet Take 180 mg by mouth daily as needed for allergies.     fluticasone (FLONASE) 50 MCG/ACT nasal spray Place into both nostrils as needed.     hydrOXYzine (ATARAX) 25 MG tablet TAKE 1 TABLET BY MOUTH 3 TIMES DAILY AS NEEDED FOR ANXIETY. 270 tablet 0   OVER THE COUNTER MEDICATION once. Zenium Tranquility Blend     QUEtiapine (SEROQUEL) 50 MG tablet Take 1.5 tablets (75 mg total) by mouth at bedtime. 45 tablet 1   Multiple Vitamin (MULTIVITAMIN) tablet Take 1 tablet by mouth as needed. (Patient not taking: Reported on 08/20/2023)     No current facility-administered medications for this visit.     Musculoskeletal: Strength & Muscle Tone:  UTA Gait & Station:  UTA Patient leans: N/A  Psychiatric Specialty Exam: Review of Systems  Psychiatric/Behavioral:  Positive for decreased concentration.        Mood swings    not currently breastfeeding.There is no height or weight on file to calculate BMI.  General Appearance: Fairly Groomed  Eye Contact:  Fair  Speech:  Clear and Coherent  Volume:  Normal  Mood:  Anxious and Mood swings  Affect:  Congruent  Thought Process:  Goal Directed and Descriptions of Associations: Intact  Orientation:  Full (Time, Place, and Person)  Thought Content: Logical   Suicidal Thoughts:  No  Homicidal Thoughts:  No  Memory:  Immediate;   Fair  Recent;   Fair Remote;   Fair  Judgement:  Fair  Insight:  Fair  Psychomotor  Activity:  Normal  Concentration:  Concentration: Fair and Attention Span: Fair  Recall:  [employer] of Knowledge: Fair  Language: Fair  Akathisia:  No  Handed:  Right  AIMS (if indicated): not done  Assets:  Communication Skills Desire for Improvement Housing Intimacy Social Support Talents/Skills Transportation  ADL's:  Intact  Cognition: WNL  Sleep:  Fair   Screenings: GAD-7    Flowsheet Row Office Visit from 08/20/2023 in [facility] Office Visit from 07/22/2023 in [facility] at [facility] Routine Prenatal from 10/08/2022 in [facility] Routine Prenatal from 08/13/2022 in [facility] for [employer] at [facility] Routine Prenatal from 05/22/2022 in Center for [employer] at [facility]  Total GAD-7 Score 18 12 8 5 16       [employer]    Flowsheet Row Office Visit from 08/20/2023 in [facility] Office Visit from 07/22/2023 in [facility] at [facility] Routine Prenatal from 10/08/2022 in [facility] for [facility] at [facility] Nutrition from 08/20/2022 in [facility] at [facility] Routine Prenatal from 08/13/2022 in [facility] for [facility] at [facility]  PHQ-2 Total Score 3 2 0 4 0  PHQ-9 Total Score 20 13 4  -- 2      Flowsheet Row Video Visit from 10/22/2023 in [facility] ED from 09/15/2023 in [facility] at [facility] Visit from 08/20/2023 in [facility]  C-SSRS RISK CATEGORY No Risk No Risk Low Risk        Assessment and Plan: Susan Suarez is a 37 year old Caucasian female, employed, married, lives in [place], has a history of bipolar disorder, GAD, attention  and concentration deficit was evaluated by telemedicine today.  Patient with some improvement with regards to her mood symptoms continues to struggle with easy distractibility, sleep problems, will benefit from readjustment of her mood stabilizer as well as ADHD testing and establishing care with a therapist.  Plan as noted below.  Plan  Bipolar disorder type I mixed in partial remission Increase Seroquel to 75 mg p.o. nightly Patient encouraged to establish care with therapist. Will consider addition of lithium in the future as needed.  GAD-improving Continue Cymbalta 60 mg p.o. daily Hydroxyzine 25 mg 3 times a day as needed. Patient to establish care with therapist, provided resources including [place] [facility]-for DBT.  Attention and concentration deficit-unstable Patient referred for ADHD testing to [place] [facility] specialist.  Patient encouraged to give them a call back.  High risk medication use-I have reviewed plan including urine drug screen-dated 08/20/2023-discussed with patient-positive for benzodiazepines otherwise negative, CMP-glucose elevated otherwise within normal limits, TSH-within normal limits, hemoglobin A1c-5.7-prediabetic range, lipid panel-LDL at 103 otherwise within normal limits.  At risk for prolonged QT syndrome-EKG reviewed-08/21/2023-normal sinus rhythm-QTc 427.  Follow-up in clinic in 8 weeks or sooner if needed.   Collaboration of Care: Collaboration of Care: Referral or follow-up with counselor/therapist AEB patient Susan Suarez to establish care with therapist.  Patient/Guardian was advised Release of Information must be obtained prior to any record release in order to collaborate their care with an outside provider. Patient/Guardian was advised if they have not already  done so to contact the registration department to sign all necessary forms in order for [place] to release information regarding their care.   Consent: Patient/Guardian gives verbal consent for  treatment and assignment of benefits for services provided during this visit. Patient/Guardian expressed understanding and agreed to proceed.   This note was generated in part or whole with voice recognition software. Voice recognition is usually quite accurate but there are transcription errors that can and very often do occur. I apologize for any typographical errors that were not detected and corrected.    Jomarie Longs, MD 10/23/2023, 12:27 PM

## 2023-10-23 ENCOUNTER — Telehealth: Payer: Self-pay | Admitting: Internal Medicine

## 2023-10-23 ENCOUNTER — Ambulatory Visit: Payer: Medicaid Other | Admitting: Internal Medicine

## 2023-10-23 NOTE — Telephone Encounter (Signed)
Copied from CRM [id number]. Topic: Appointment Scheduling - Scheduling Inquiry for Clinic >> Oct 23, 2023  9:44 AM Everette C wrote: Reason for CRM: The patient would like to change their 3:40 PM appt to virtual   Please contact the patient further when possible

## 2023-11-13 ENCOUNTER — Other Ambulatory Visit: Payer: Self-pay | Admitting: Psychiatry

## 2023-11-13 DIAGNOSIS — F3161 Bipolar disorder, current episode mixed, mild: Secondary | ICD-10-CM

## 2023-12-21 ENCOUNTER — Telehealth: Payer: Medicaid Other | Admitting: Psychiatry

## 2023-12-21 ENCOUNTER — Encounter: Payer: Self-pay | Admitting: Psychiatry

## 2023-12-21 DIAGNOSIS — F411 Generalized anxiety disorder: Secondary | ICD-10-CM

## 2023-12-21 DIAGNOSIS — R41840 Attention and concentration deficit: Secondary | ICD-10-CM | POA: Diagnosis not present

## 2023-12-21 DIAGNOSIS — F3178 Bipolar disorder, in full remission, most recent episode mixed: Secondary | ICD-10-CM

## 2023-12-21 DIAGNOSIS — F1021 Alcohol dependence, in remission: Secondary | ICD-10-CM | POA: Diagnosis not present

## 2023-12-21 DIAGNOSIS — F3161 Bipolar disorder, current episode mixed, mild: Secondary | ICD-10-CM

## 2023-12-30 ENCOUNTER — Ambulatory Visit: Payer: Medicaid Other | Admitting: Family Medicine

## 2023-12-30 VITALS — BP 113/76 | HR 85 | Wt 146.0 lb

## 2023-12-30 DIAGNOSIS — Z01411 Encounter for gynecological examination (general) (routine) with abnormal findings: Secondary | ICD-10-CM

## 2023-12-30 DIAGNOSIS — Z3202 Encounter for pregnancy test, result negative: Secondary | ICD-10-CM | POA: Diagnosis not present

## 2023-12-30 DIAGNOSIS — N912 Amenorrhea, unspecified: Secondary | ICD-10-CM | POA: Diagnosis not present

## 2023-12-30 DIAGNOSIS — Z3041 Encounter for surveillance of contraceptive pills: Secondary | ICD-10-CM

## 2023-12-30 LAB — POCT URINE PREGNANCY: Preg Test, Ur: NEGATIVE

## 2023-12-30 MED ORDER — SLYND 4 MG PO TABS
4.0000 mg | ORAL_TABLET | Freq: Every day | ORAL | 5 refills | Status: DC
Start: 2023-12-30 — End: 2024-03-15

## 2023-12-30 NOTE — Progress Notes (Signed)
 Subjective:     Susan Suarez is a 38 y.o. female and is here for a comprehensive physical exam. The patient reports problems - wants to skip placebos. Doing well from mental health stand point. Seeing a psychiatrist. Has been out of her Slynd  and had no cycle x 7 days.  The following portions of the patient's history were reviewed and updated as appropriate: allergies, current medications, past family history, past medical history, past social history, past surgical history, and problem list.  Review of Systems Pertinent items noted in HPI and remainder of comprehensive ROS otherwise negative.   Objective:    BP 113/76   Pulse 85   Wt 146 lb (66.2 kg)   BMI 26.70 kg/m  General appearance: alert, cooperative, and appears stated age Head: Normocephalic, without obvious abnormality, atraumatic Neck: supple, symmetrical, trachea midline Lungs: clear to auscultation bilaterally Heart: regular rate and rhythm, S1, S2 normal, no murmur, click, rub or gallop Abdomen: soft, non-tender; bowel sounds normal; no masses,  no organomegaly Extremities: extremities normal, atraumatic, no cyanosis or edema Skin: Skin color, texture, turgor normal. No rashes or lesions Neurologic: Grossly normal    UPT negative Assessment:    Healthy female exam.      Plan:  Amenorrhea - UPT negative - Plan: POCT urine pregnancy  Encounter for gynecological examination with abnormal finding - pap smear is up to date.  Encounter for surveillance of contraceptive pills - refilled her Slynd , skip placebos. - Plan: Drospirenone  (SLYND ) 4 MG TABS    See After Visit Summary for Counseling Recommendations

## 2023-12-30 NOTE — Patient Instructions (Signed)

## 2023-12-30 NOTE — Progress Notes (Signed)
 Patient presents for Annual.  LMP: OCP cont.  Last pap: Date: 04/16/22 Contraception: OCP Mammogram: Not yet indicated STD Screening: Declines Flu Vaccine : Declines  CC:  Annual  Discuss options of birth control  Ran out of slynd    Period 7 days late

## 2024-02-14 ENCOUNTER — Other Ambulatory Visit: Payer: Self-pay | Admitting: Psychiatry

## 2024-02-14 DIAGNOSIS — F3161 Bipolar disorder, current episode mixed, mild: Secondary | ICD-10-CM

## 2024-02-15 ENCOUNTER — Telehealth: Payer: Self-pay

## 2024-02-15 NOTE — Telephone Encounter (Signed)
 Received fax requesting a Prior Authorization for patients Quetiapine Fumarate 50 mg tablets 135 tablets initiated via CoverMyMeds  sent to patients insurance [place] Medicaid [employer] [place]   Approved   [place] Case [id number] Coverage 02-15-24-------02-14-25

## 2024-02-24 ENCOUNTER — Encounter: Payer: Self-pay | Admitting: Internal Medicine

## 2024-02-25 ENCOUNTER — Ambulatory Visit: Payer: Medicaid Other | Admitting: Internal Medicine

## 2024-02-25 VITALS — BP 118/72 | HR 96 | Ht 62.0 in | Wt 143.1 lb

## 2024-02-25 DIAGNOSIS — R7303 Prediabetes: Secondary | ICD-10-CM

## 2024-02-25 DIAGNOSIS — E785 Hyperlipidemia, unspecified: Secondary | ICD-10-CM

## 2024-02-25 DIAGNOSIS — F322 Major depressive disorder, single episode, severe without psychotic features: Secondary | ICD-10-CM | POA: Diagnosis not present

## 2024-02-25 NOTE — Assessment & Plan Note (Signed)
 Being followed by Psych Starting DBT Seeing psych next week for worsening symptoms

## 2024-02-25 NOTE — Progress Notes (Signed)
 Date:  02/25/2024   Name:  Susan Suarez   DOB:  04/24/1986   MRN:  [id number]   Chief Complaint: Prediabetes  Diabetes She presents for her follow-up diabetic visit. Diabetes type: prediabetes and hx of gestational DM. Her disease course has been stable. Hypoglycemia symptoms include nervousness/anxiousness. Pertinent negatives for hypoglycemia include no headaches or tremors. Pertinent negatives for diabetes include no blurred vision, no chest pain, no fatigue, no foot paresthesias, no polydipsia and no polyuria. Symptoms are stable.  Depression        This is a chronic problem.  The problem has been gradually worsening since onset.  Associated symptoms include no fatigue, no appetite change and no headaches.  Past treatments include SNRIs - Serotonin and norepinephrine reuptake inhibitors (and Seroquel). She is struggling with depression and excessive worry and OCD symptoms.  She has started DBT and sees psych next week to discuss medication changes.  She is drinking some alcohol but is not driving or otherwise unsafe.  Review of Systems  Constitutional:  Negative for appetite change, fatigue, fever and unexpected weight change.  HENT:  Negative for tinnitus and trouble swallowing.   Eyes:  Negative for blurred vision and visual disturbance.  Respiratory:  Negative for cough, chest tightness and shortness of breath.   Cardiovascular:  Negative for chest pain, palpitations and leg swelling.  Gastrointestinal:  Negative for abdominal pain.  Endocrine: Negative for polydipsia and polyuria.  Genitourinary:  Negative for dysuria and hematuria.  Musculoskeletal:  Negative for arthralgias.  Neurological:  Negative for tremors, numbness and headaches.  Psychiatric/Behavioral:  Positive for depression and dysphoric mood. Negative for sleep disturbance. The patient is nervous/anxious.      Lab Results  Component Value Date   NA 138 08/20/2023   K 3.5 08/20/2023   CO2 25 08/20/2023    GLUCOSE 105 (H) 08/20/2023   BUN 8 08/20/2023   CREATININE 0.64 08/20/2023   CALCIUM 8.9 08/20/2023   GFRNONAA >60 08/20/2023   Lab Results  Component Value Date   CHOL 175 08/20/2023   HDL 62 08/20/2023   LDLCALC 103 (H) 08/20/2023   TRIG 52 08/20/2023   CHOLHDL 2.8 08/20/2023   Lab Results  Component Value Date   TSH 1.373 08/20/2023   Lab Results  Component Value Date   HGBA1C 5.7 (H) 08/20/2023   Lab Results  Component Value Date   WBC 9.4 10/13/2022   HGB 11.6 (L) 10/13/2022   HCT 35.4 (L) 10/13/2022   MCV 91.5 10/13/2022   PLT 164 10/13/2022   Lab Results  Component Value Date   ALT 12 08/20/2023   AST 15 08/20/2023   ALKPHOS 57 08/20/2023   BILITOT 0.7 08/20/2023   No results found for: "25OHVITD2", "25OHVITD3", "VD25OH"   Patient Active Problem List   Diagnosis Date Noted   History of alcoholism (HCC) 08/20/2023   Attention and concentration deficit 08/20/2023   Bipolar 1 disorder, mixed, mild (HCC) 08/20/2023   High risk medication use 08/20/2023   At risk for prolonged QT interval syndrome 08/20/2023   Current severe episode of major depressive disorder without psychotic features without prior episode (HCC) 07/22/2023   Prediabetes 07/22/2023   History of actinic keratosis 04/16/2022   Previous Classical Cesarean Section 04/16/2022   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 10/30/2021   Rosacea 05/14/2021   Acne vulgaris 05/14/2021   Recurrent major depressive disorder (HCC) 10/28/2017   Cervical neuropathy 04/07/2016  GAD (generalized anxiety disorder) 04/07/2016   Chronic pain of multiple sites 04/07/2016    Allergies  Allergen Reactions   Chlorhexidine Gluconate Rash    wash    Past Surgical History:  Procedure Laterality Date   CESAREAN SECTION     CESAREAN SECTION N/A 10/12/2022   Procedure: CESAREAN SECTION;  Surgeon: Reva Bores, MD;  Location: [facility];  Service: Obstetrics;   Laterality: N/A;   COSMETIC SURGERY  2006   Mole removal on nose   WISDOM TOOTH EXTRACTION      Social History   Tobacco Use   Smoking status: Former    Current packs/day: 0.00    Average packs/day: 1.5 packs/day for 15.0 years (22.5 ttl pk-yrs)    Types: Cigarettes    Quit date: 09/21/2018    Years since quitting: 5.4   Smokeless tobacco: Never  Vaping Use   Vaping status: Never Used  Substance Use Topics   Alcohol use: Yes    Alcohol/week: 3.0 - 5.0 standard drinks of alcohol    Types: 3 - 5 Glasses of wine per week   Drug use: Not Currently     Medication list has been reviewed and updated.  Current Meds  Medication Sig   Drospirenone (SLYND) 4 MG TABS Take 1 tablet (4 mg total) by mouth daily. Skip placebos   DULoxetine (CYMBALTA) 60 MG capsule Take 60 mg by mouth daily.   fexofenadine (ALLEGRA) 180 MG tablet Take 180 mg by mouth daily as needed for allergies.   fluticasone (FLONASE) 50 MCG/ACT nasal spray Place into both nostrils as needed.   hydrOXYzine (ATARAX) 25 MG tablet TAKE 1 TABLET BY MOUTH 3 TIMES DAILY AS NEEDED FOR ANXIETY. (Patient taking differently: Take 25 mg by mouth as needed for anxiety.)   QUEtiapine (SEROQUEL) 50 MG tablet TAKE 1 AND 1/2 TABLETS BY MOUTH AT BEDTIME       02/25/2024   10:51 AM 12/30/2023   11:01 AM 08/20/2023   12:07 PM 07/22/2023    3:31 PM  GAD 7 : Generalized Anxiety Score  Nervous, Anxious, on Edge 1 1 3 2   Control/stop worrying 1 1 3 2   Worry too much - different things 2 1 3 2   Trouble relaxing 3 1 3 3   Restless 1 1 3 2   Easily annoyed or irritable 3 2 2 1   Afraid - awful might happen 0 1 1 0  Total GAD 7 Score 11 8 18 12   Anxiety Difficulty Somewhat difficult Not difficult at all Somewhat difficult Somewhat difficult       02/25/2024   10:50 AM 12/30/2023   11:01 AM 08/20/2023   12:07 PM  Depression screen PHQ 2/9  Decreased Interest 1 1 2   Down, Depressed, Hopeless 1 1 1   PHQ - 2 Score 2 2 3   Altered sleeping 3 3 3    Tired, decreased energy 3 3 3   Change in appetite 2 1 3   Feeling bad or failure about yourself  0 1 1  Trouble concentrating 1 3 3   Moving slowly or fidgety/restless 0 1 3  Suicidal thoughts 0 0 1  PHQ-9 Score 11 14 20   Difficult doing work/chores Somewhat difficult Not difficult at all Somewhat difficult    BP Readings from Last 3 Encounters:  02/25/24 118/72  12/30/23 113/76  09/15/23 97/67    Physical Exam Vitals and nursing note reviewed.  Constitutional:      General: She is not in acute distress.    Appearance:  Normal appearance. She is well-developed.  HENT:     Head: Normocephalic and atraumatic.  Neck:     Vascular: No carotid bruit.  Cardiovascular:     Rate and Rhythm: Normal rate and regular rhythm.     Heart sounds: No murmur heard. Pulmonary:     Effort: Pulmonary effort is normal. No respiratory distress.     Breath sounds: No wheezing or rhonchi.  Musculoskeletal:     Cervical back: Normal range of motion and neck supple.     Right lower leg: No edema.     Left lower leg: No edema.  Lymphadenopathy:     Cervical: No cervical adenopathy.  Skin:    General: Skin is warm and dry.     Findings: No rash.  Neurological:     Mental Status: She is alert and oriented to person, place, and time.  Psychiatric:        Mood and Affect: Mood normal.        Behavior: Behavior normal.     Wt Readings from Last 3 Encounters:  02/25/24 143 lb 2 oz (64.9 kg)  12/30/23 146 lb (66.2 kg)  09/15/23 140 lb (63.5 kg)    BP 118/72   Pulse 96   Ht 5\' 2"  (1.575 m)   Wt 143 lb 2 oz (64.9 kg)   LMP  (LMP Unknown)   SpO2 94%   Breastfeeding No   BMI 26.18 kg/m   Assessment and Plan:  Problem List Items Addressed This Visit       Unprioritized   Current severe episode of major depressive disorder without psychotic features without prior episode (HCC)   Being followed by Psych Starting DBT Seeing psych next week for worsening symptoms      Relevant Orders    TSH   Prediabetes - Primary   She is watching her diet some and exercising by walking to and from employee parking Will recheck today      Relevant Orders   Comprehensive metabolic panel   Hemoglobin A1c   Other Visit Diagnoses       Mild hyperlipidemia       Relevant Orders   Lipid panel       No follow-ups on file.    Reubin Milan, MD [facility] [place]

## 2024-02-25 NOTE — Assessment & Plan Note (Signed)
 She is watching her diet some and exercising by walking to and from employee parking Will recheck today

## 2024-02-26 ENCOUNTER — Encounter: Payer: Self-pay | Admitting: Internal Medicine

## 2024-02-26 LAB — HEMOGLOBIN A1C
Est. average glucose Bld gHb Est-mCnc: 120 mg/dL
Hgb A1c MFr Bld: 5.8 % — ABNORMAL HIGH (ref 4.8–5.6)

## 2024-02-26 LAB — LIPID PANEL
Chol/HDL Ratio: 3.6 ratio (ref 0.0–4.4)
Cholesterol, Total: 160 mg/dL (ref 100–199)
HDL: 44 mg/dL (ref >39)
LDL Chol Calc (NIH): 95 mg/dL (ref 0–99)
Triglycerides: 117 mg/dL (ref 0–149)
VLDL Cholesterol Cal: 21 mg/dL (ref 5–40)

## 2024-02-26 LAB — COMPREHENSIVE METABOLIC PANEL
ALT: 17 IU/L (ref 0–32)
AST: 20 IU/L (ref 0–40)
Albumin: 4.4 g/dL (ref 3.9–4.9)
Alkaline Phosphatase: 74 IU/L (ref 44–121)
BUN/Creatinine Ratio: 11 (ref 9–23)
BUN: 8 mg/dL (ref 6–20)
Bilirubin Total: 0.2 mg/dL (ref 0.0–1.2)
CO2: 25 mmol/L (ref 20–29)
Calcium: 9.5 mg/dL (ref 8.7–10.2)
Chloride: 101 mmol/L (ref 96–106)
Creatinine, Ser: 0.7 mg/dL (ref 0.57–1.00)
Globulin, Total: 2.7 g/dL (ref 1.5–4.5)
Glucose: 97 mg/dL (ref 70–99)
Potassium: 4.8 mmol/L (ref 3.5–5.2)
Sodium: 141 mmol/L (ref 134–144)
Total Protein: 7.1 g/dL (ref 6.0–8.5)
eGFR: 113 mL/min/{1.73_m2} (ref >59)

## 2024-02-26 LAB — TSH: TSH: 0.627 u[IU]/mL (ref 0.450–4.500)

## 2024-03-03 ENCOUNTER — Telehealth (INDEPENDENT_AMBULATORY_CARE_PROVIDER_SITE_OTHER): Payer: Medicaid Other | Admitting: Psychiatry

## 2024-03-03 ENCOUNTER — Encounter: Payer: Self-pay | Admitting: Psychiatry

## 2024-03-03 DIAGNOSIS — F3162 Bipolar disorder, current episode mixed, moderate: Secondary | ICD-10-CM

## 2024-03-03 DIAGNOSIS — F3178 Bipolar disorder, in full remission, most recent episode mixed: Secondary | ICD-10-CM | POA: Insufficient documentation

## 2024-03-03 DIAGNOSIS — F411 Generalized anxiety disorder: Secondary | ICD-10-CM

## 2024-03-03 DIAGNOSIS — R41840 Attention and concentration deficit: Secondary | ICD-10-CM

## 2024-03-03 DIAGNOSIS — F1021 Alcohol dependence, in remission: Secondary | ICD-10-CM | POA: Diagnosis not present

## 2024-03-03 MED ORDER — QUETIAPINE FUMARATE 100 MG PO TABS
100.0000 mg | ORAL_TABLET | Freq: Every day | ORAL | 1 refills | Status: DC
Start: 2024-03-03 — End: 2024-06-26

## 2024-03-03 NOTE — Progress Notes (Unsigned)
 Virtual Visit via Video Note  I connected with Susan Suarez on 03/03/24 at  3:40 PM EDT by a video enabled telemedicine application and verified that I am speaking with the correct person using two identifiers.  Location Provider Location : [facility] Patient Location : Home  Participants: Patient , Provider    I discussed the limitations of evaluation and management by telemedicine and the availability of in person appointments. The patient expressed understanding and agreed to proceed.   I discussed the assessment and treatment plan with the patient. The patient was provided an opportunity to ask questions and all were answered. The patient agreed with the plan and demonstrated an understanding of the instructions.   The patient was advised to call back or seek an in-person evaluation if the symptoms worsen or if the condition fails to improve as anticipated.  [facility] MD OP Progress Note  03/04/2024 7:46 AM Susan Suarez  MRN:  [id number]  Chief Complaint:  Chief Complaint  Patient presents with   Follow-up   Depression   Anxiety   Medication Refill   HPI: Susan Suarez is a 38 year old Caucasian female who is married, lives in [place], has a history of bipolar disorder, GAD, attention concentration deficit, history of alcoholism, back pain was evaluated by telemedicine today.  She has been experiencing increased depression and anxiety symptoms, which she attributes to life uncertainties, including starting a new job influenced by external factors. She is concerned about potential job instability due to funding cuts and its impact on her family. She has experienced heart palpitations, which she attributes to anxiety, and has had a couple of panic attacks recently. No thoughts of self-harm.  She recently completed an evaluation with an attention specialist who concluded that she does not have ADHD but may have OCD, with an emphasis on obsessive thoughts. She describes her  thoughts as a distraction and notes that she 'does not stop thinking.'  She is currently taking Seroquel 75 mg, Cymbalta 60 mg, and hydroxyzine. She notes that the effectiveness of Seroquel has decreased, with a resurgence of racing thoughts and anxiety, particularly around the holidays.  She has started DBT therapy and plans to attend a skills class on weekends. She has had two sessions so far but had to cancel a recent one due to her son's illness.  Her sleep has been affected, with difficulty falling asleep and waking up in the middle of the night. She has tried mindfulness and relaxation techniques and has cut out electronics before bed to improve sleep quality.  Her daughter, aged four, has been assessed and found to be on the autism spectrum, which has added stress to her life. She is ensuring her daughter receives early intervention services.  Visit Diagnosis:    ICD-10-CM   1. Bipolar 1 disorder, mixed, moderate (HCC)  F31.62 QUEtiapine (SEROQUEL) 100 MG tablet    2. GAD (generalized anxiety disorder)  F41.1     3. History of alcoholism (HCC)  F10.21     4. Attention and concentration deficit  R41.840       Past Psychiatric History: I have reviewed past psychiatric history from progress note on 08/20/2023.  Past trials of Zoloft, Ritalin, Concerta, Intuniv, duloxetine, Xanax.  Past Medical History:  Past Medical History:  Diagnosis Date   ADHD    Allergy    Seasonal and surgical cleanser.   Anemia 2020   First Pregnancy   Anxiety    Depression    GDM, class  A2 08/14/2022   Gestational diabetes     Past Surgical History:  Procedure Laterality Date   CESAREAN SECTION     CESAREAN SECTION N/A 10/12/2022   Procedure: CESAREAN SECTION;  Surgeon: Reva Bores, MD;  Location: [facility] LD [facility];  Service: Obstetrics;  Laterality: N/A;   COSMETIC SURGERY  2006   Mole removal on nose   WISDOM TOOTH EXTRACTION      Family Psychiatric History: I have reviewed family psychiatric  history from progress note on 08/20/2023.  Family History:  Family History  Problem Relation Age of Onset   Arthritis Mother    Varicose Veins Mother    Alcohol abuse Father    Bipolar disorder Father    Diabetes Father    ADD / ADHD Father    Depression Father    Intellectual disability Sister    Varicose Veins Sister    Learning disabilities Sister    Cancer Maternal Aunt        cervical   Obesity Maternal Aunt    Varicose Veins Maternal Aunt    Cancer Maternal Uncle    Cancer Paternal Uncle    Hypertension Maternal Grandfather    Stroke Maternal Grandfather    Hypertension Maternal Grandmother    Heart disease Maternal Grandmother    Varicose Veins Maternal Grandmother    Bipolar disorder Paternal Grandfather    Hypertension Paternal Grandfather    Diabetes Paternal Grandfather    Heart disease Paternal Grandfather    Hypertension Paternal Grandmother    Bipolar disorder Other    Asthma Neg Hx     Social History: I have reviewed social history from progress note on 08/20/2023. Social History   Socioeconomic History   Marital status: Married    Spouse name: Not on file   Number of children: 2   Years of education: Not on file   Highest education level: Bachelor's degree (e.g., BA, AB, BS)  Occupational History   Not on file  Tobacco Use   Smoking status: Former    Current packs/day: 0.00    Average packs/day: 1.5 packs/day for 15.0 years (22.5 ttl pk-yrs)    Types: Cigarettes    Quit date: 09/21/2018    Years since quitting: 5.4   Smokeless tobacco: Never  Vaping Use   Vaping status: Never Used  Substance and Sexual Activity   Alcohol use: Yes    Alcohol/week: 3.0 - 5.0 standard drinks of alcohol    Types: 3 - 5 Glasses of wine per week   Drug use: Not Currently   Sexual activity: Yes    Birth control/protection: Pill  Other Topics Concern   Not on file  Social History Narrative   Not on file   Social Drivers of Health   Financial Resource Strain:  Low Risk  (02/25/2024)   Overall Financial Resource Strain (CARDIA)    Difficulty of Paying Living Expenses: Not hard at all  Food Insecurity: No Food Insecurity (02/25/2024)   Hunger Vital Sign    Worried About Running Out of Food in the Last Year: Never true    Ran Out of Food in the Last Year: Never true  Transportation Needs: No Transportation Needs (02/25/2024)   PRAPARE - Administrator, Civil Service (Medical): No    Lack of Transportation (Non-Medical): No  Physical Activity: Insufficiently Active (02/25/2024)   Exercise Vital Sign    Days of Exercise per Week: 2 days    Minutes of Exercise per Session: 10 min  Stress: Stress Concern Present (02/25/2024)   [employer] of Occupational Health - Occupational Stress Questionnaire    Feeling of Stress : Very much  Social Connections: Socially Isolated (02/25/2024)   Social Connection and Isolation Panel [NHANES]    Frequency of Communication with Friends and Family: Once a week    Frequency of Social Gatherings with Friends and Family: Once a week    Attends Religious Services: Never    Database administrator or Organizations: No    Attends Engineer, structural: Not on file    Marital Status: Married    Allergies:  Allergies  Allergen Reactions   Chlorhexidine Gluconate Rash    wash    Metabolic Disorder Labs: Lab Results  Component Value Date   HGBA1C 5.8 (H) 02/25/2024   MPG 116.89 08/20/2023   No results found for: "PROLACTIN" Lab Results  Component Value Date   CHOL 160 02/25/2024   TRIG 117 02/25/2024   HDL 44 02/25/2024   CHOLHDL 3.6 02/25/2024   VLDL 10 08/20/2023   LDLCALC 95 02/25/2024   LDLCALC 103 (H) 08/20/2023   Lab Results  Component Value Date   TSH 0.627 02/25/2024   TSH 1.373 08/20/2023    Therapeutic Level Labs: No results found for: "LITHIUM" No results found for: "VALPROATE" No results found for: "CBMZ"  Current Medications: Current Outpatient Medications   Medication Sig Dispense Refill   QUEtiapine (SEROQUEL) 100 MG tablet Take 1 tablet (100 mg total) by mouth at bedtime. 30 tablet 1   Drospirenone (SLYND) 4 MG TABS Take 1 tablet (4 mg total) by mouth daily. Skip placebos 84 tablet 5   DULoxetine (CYMBALTA) 60 MG capsule Take 60 mg by mouth daily.     fexofenadine (ALLEGRA) 180 MG tablet Take 180 mg by mouth daily as needed for allergies.     fluticasone (FLONASE) 50 MCG/ACT nasal spray Place into both nostrils as needed.     hydrOXYzine (ATARAX) 25 MG tablet TAKE 1 TABLET BY MOUTH 3 TIMES DAILY AS NEEDED FOR ANXIETY. (Patient taking differently: Take 25 mg by mouth as needed for anxiety.) 270 tablet 0   Multiple Vitamin (MULTIVITAMIN) tablet Take 1 tablet by mouth as needed. (Patient not taking: Reported on 02/25/2024)     OVER THE COUNTER MEDICATION once. Zenium Tranquility Blend (Patient not taking: Reported on 02/25/2024)     No current facility-administered medications for this visit.     Musculoskeletal: Strength & Muscle Tone:  UTA Gait & Station:  Seated Patient leans: N/A  Psychiatric Specialty Exam: Review of Systems  Psychiatric/Behavioral:  Positive for decreased concentration, dysphoric mood and sleep disturbance. The patient is nervous/anxious.     not currently breastfeeding.There is no height or weight on file to calculate BMI.  General Appearance: Fairly Groomed  Eye Contact:  Fair  Speech:  Clear and Coherent  Volume:  Normal  Mood:  Anxious and Depressed  Affect:  Tearful  Thought Process:  Goal Directed and Descriptions of Associations: Intact  Orientation:  Full (Time, Place, and Person)  Thought Content: Rumination   Suicidal Thoughts:  No  Homicidal Thoughts:  No  Memory:  Immediate;   Fair Recent;   Fair Remote;   Fair  Judgement:  Fair  Insight:  Fair  Psychomotor Activity:  Normal  Concentration:  Concentration: Fair and Attention Span: Fair  Recall:  [employer] of Knowledge: Fair  Language: Fair   Akathisia:  No  Handed:  Right  AIMS (if indicated): not  done  Assets:  Desire for Improvement Housing Social Support Transportation  ADL's:  Intact  Cognition: WNL  Sleep:  Poor   Screenings: AUDIT    Flowsheet Row Office Visit from 02/25/2024 in [facility] at [facility] [place]  Alcohol Use Disorder Identification Test Final Score (AUDIT) 9       GAD-7    Flowsheet Row Office Visit from 02/25/2024 in [facility] Care & [facility] Medicine at [facility] Office Visit from 12/30/2023 in [facility] for [facility] at [facility] Office Visit from 08/20/2023 in [facility] Office Visit from 07/22/2023 in [facility] Primary Care & Sports Medicine at [facility] Routine Prenatal from 10/08/2022 in [facility] for [facility] at [facility]  Total GAD-7 Score 11 8 18 12 8       PHQ2-9    Flowsheet Row Office Visit from 02/25/2024 in [facility] Medicine at [facility] Office Visit from 12/30/2023 in [facility] for [facility] at [facility] Office Visit from 08/20/2023 in [facility] Office Visit from 07/22/2023 in [facility] Primary Care & [facility] at [facility] Routine Prenatal from 10/08/2022 in [facility] for [facility] at [facility]  PHQ-2 Total Score 2 2 3 2  0  PHQ-9 Total Score 11 14 20 13 4       Flowsheet Row Video Visit from 03/03/2024 in [facility] Video Visit from 12/21/2023 in [facility] Video Visit from 10/22/2023 in [facility]  C-SSRS RISK CATEGORY No Risk No Risk No Risk        Assessment and Plan: BREKLYN FABRIZIO is a 38 year old Caucasian female, employed, married, lives in [place] has a history of bipolar disorder,  attention concentration deficit was evaluated by telemedicine today.  Discussed assessment and plan as noted below.  Bipolar Disorder type I mixed-unstable Bipolar disorder with increased depressive symptoms due to situational stressors. Current medication includes Seroquel 75 mg, Cymbalta 60 mg, and hydroxyzine. Seroquel's effectiveness has decreased, with increased racing thoughts and anxiety. Consider increasing Seroquel dosage. - Increase Seroquel to 100 mg. - Ensure adequate sleep before increasing Seroquel dosage. - Continue Cymbalta 60 mg daily - Encourage relaxation techniques such as mindfulness and the Calm app before bed.  Anxiety-unstable Anxiety with symptoms including heart palpitations and panic attacks, exacerbated by situational stressors. Symptoms present before starting Seroquel. No current thoughts of self-harm. - Continue current medication regimen with increased Seroquel dosage. - Encourage continued therapy and use of relaxation techniques. - Continue Hydroxyzine 25 mg 3 times a day as needed - Continue Cymbalta 60 mg daily - Continue psychotherapy with [facility], also starting DBT skills groups.  Rule out ADHD-unstable Patient completed evaluation at [facility] attention specialist pending report. - Patient to sign a release to obtain most recent testing report.  Follow-up - Follow-up in clinic in 3 to 4 weeks or sooner-April 14 at 3:40 PM.  Collaboration of Care: Collaboration of Care: Referral or follow-up with counselor/therapist AEB encouraged to continue CBT  Patient/Guardian was advised Release of Information must be obtained prior to any record release in order to collaborate their care with an outside provider. Patient/Guardian was advised if they have not already done so to contact the registration department to sign all necessary forms in order for [place] to release information regarding their care.  Consent: Patient/Guardian gives verbal consent for  treatment and assignment of benefits for services provided during this visit. Patient/Guardian expressed understanding and agreed to proceed.  Discussed the use of a AI scribe software for clinical note transcription with the patient, who gave verbal consent to proceed.  This note was generated in part or whole with voice recognition software. Voice recognition is usually quite accurate but there are transcription errors that can and very often do occur. I apologize for any typographical errors that were not detected and corrected.     Jomarie Longs, MD 03/04/2024, 7:46 AM

## 2024-03-09 ENCOUNTER — Telehealth: Payer: Self-pay | Admitting: Psychiatry

## 2024-03-09 NOTE — Telephone Encounter (Signed)
 I have reviewed ADHD testing report from [facility]-encounter date 01/27/2024.  Anxiety is primary condition affecting cognitive function leading to forgetfulness and misplacing items.  Symptoms do not align with ADHD as executive functioning is largely intact except for issues related to memory and misplacing objects.  Anxiety exacerbated by lack of routine, structure particularly in home environment with children.  Sensory processing issues related to anxiety such as difficulty concentrating and noisy environment are also present.  Consider restarting guanfacine alone to assist with emotional regulation and impulse control as it may help in slowing down her cognitive processes and improving presence without the side effect experienced with Ritalin.

## 2024-03-14 ENCOUNTER — Other Ambulatory Visit: Payer: Self-pay | Admitting: Family Medicine

## 2024-03-14 DIAGNOSIS — Z3041 Encounter for surveillance of contraceptive pills: Secondary | ICD-10-CM

## 2024-03-15 ENCOUNTER — Encounter: Payer: Self-pay | Admitting: Family Medicine

## 2024-04-04 ENCOUNTER — Encounter: Payer: Self-pay | Admitting: Psychiatry

## 2024-04-04 ENCOUNTER — Telehealth (INDEPENDENT_AMBULATORY_CARE_PROVIDER_SITE_OTHER): Admitting: Psychiatry

## 2024-04-04 DIAGNOSIS — F1021 Alcohol dependence, in remission: Secondary | ICD-10-CM | POA: Diagnosis not present

## 2024-04-04 DIAGNOSIS — R0683 Snoring: Secondary | ICD-10-CM

## 2024-04-04 DIAGNOSIS — F3177 Bipolar disorder, in partial remission, most recent episode mixed: Secondary | ICD-10-CM

## 2024-04-04 DIAGNOSIS — F411 Generalized anxiety disorder: Secondary | ICD-10-CM | POA: Diagnosis not present

## 2024-04-04 DIAGNOSIS — F3162 Bipolar disorder, current episode mixed, moderate: Secondary | ICD-10-CM | POA: Insufficient documentation

## 2024-04-04 DIAGNOSIS — R41840 Attention and concentration deficit: Secondary | ICD-10-CM

## 2024-04-04 MED ORDER — GUANFACINE HCL ER 1 MG PO TB24
1.0000 mg | ORAL_TABLET | Freq: Every day | ORAL | 1 refills | Status: DC
Start: 2024-04-04 — End: 2024-05-09

## 2024-04-04 NOTE — Patient Instructions (Signed)
 Guanfacine Extended-Release Tablets What is this medication? GUANFACINE [facility] fa seen) treats attention-deficit hyperactivity disorder (ADHD). It works by improving focus and reducing impulsive behavior. This medicine may be used for other purposes; ask your health care provider or pharmacist if you have questions. COMMON BRAND NAME(S): Intuniv What should I tell my care team before I take this medication? They need to know if you have any of these conditions: High blood pressure Kidney disease Liver disease Low blood pressure Slow heart rate An unusual or allergic reaction to guanfacine, other medications, foods, dyes, or preservatives Pregnant or trying to get pregnant Breast-feeding How should I use this medication? Take this medication by mouth with a glass of water. Follow the directions on the prescription label. Do not cut, crush, or chew this medication. Do not take this medication with a high-fat meal. Take your medication at regular intervals. Do not take it more often than directed. Do not stop taking except on your care team's advice. Stopping this medication too quickly may cause serious side effects. Ask your care team for advice. Talk to your care team about the use of this medication in children. While it may be prescribed for children as young as 6 years for selected conditions, precautions do apply. Overdosage: If you think you have taken too much of this medicine contact a poison control center or emergency room at once. NOTE: This medicine is only for you. Do not share this medicine with others. What if I miss a dose? If you miss a dose, take it as soon as you can. If it is almost time for your next dose, take only that dose. Do not take double or extra doses. If you miss 2 or more doses in a row, you should contact your care team. You may need to restart your medication at a lower dose. What may interact with this medication? This medication may interact with the  following: Certain medications for blood pressure, heart disease, irregular heartbeat Certain medications for mental health conditions Certain medications for seizures, such as carbamazepine, phenobarbital, phenytoin Certain medications for sleep Ketoconazole Opioid medications Rifampin This list may not describe all possible interactions. Give your health care provider a list of all the medicines, herbs, non-prescription drugs, or dietary supplements you use. Also tell them if you smoke, drink alcohol, or use illegal drugs. Some items may interact with your medicine. What should I watch for while using this medication? Visit your care team for regular checks on your progress. Check your heart rate and blood pressure as directed. Ask your care team what your heart rate and blood pressure should be and when you should contact them. You may get dizzy or drowsy. Do not drive, use machinery, or do anything that needs mental alertness until you know how this medication affects you. Do not stand or sit up quickly, especially if you are an older patient. This reduces the risk of dizzy or fainting spells. Alcohol can make you more drowsy and dizzy. Avoid alcoholic drinks. Avoid becoming dehydrated or overheated while taking this medication. Tell your care team if you have been vomiting and cannot take this medication because you may be at risk for a sudden and large increase in blood pressure called rebound hypertension. Your mouth may get dry. Chewing sugarless gum or sucking hard candy, and drinking plenty of water may help. Contact your care team if the problem does not go away or is severe. What side effects may I notice from receiving this medication? Side  effects that you should report to your care team as soon as possible: Allergic reactions--skin rash, itching, hives, swelling of the face, lips, tongue, or throat Low blood pressure--dizziness, feeling faint or lightheaded, blurry vision Slow  heartbeat--dizziness, feeling faint or lightheaded, confusion, trouble breathing, unusual weakness or fatigue Side effects that usually do not require medical attention (report to your care team if they continue or are bothersome): Dizziness Drowsiness Dry mouth Fatigue Headache Nausea Stomach pain This list may not describe all possible side effects. Call your doctor for medical advice about side effects. You may report side effects to FDA at 1-800-FDA-1088. Where should I keep my medication? Keep out of the reach of children and pets. Store at room temperature between 15 and 30 degrees C (59 and 86 degrees F). Throw away any unused medication after the expiration date. NOTE: This sheet is a summary. It may not cover all possible information. If you have questions about this medicine, talk to your doctor, pharmacist, or health care provider.  2024 Elsevier/Gold Standard (2021-11-04 00:00:00)

## 2024-04-04 NOTE — Progress Notes (Signed)
 Virtual Visit via Video Note  I connected with Susan Suarez on 04/04/24 at  3:40 PM EDT by a video enabled telemedicine application and verified that I am speaking with the correct person using two identifiers.  Location Provider Location : [facility] Patient Location : Work  Participants: Patient , Provider   I discussed the limitations of evaluation and management by telemedicine and the availability of in person appointments. The patient expressed understanding and agreed to proceed.   I discussed the assessment and treatment plan with the patient. The patient was provided an opportunity to ask questions and all were answered. The patient agreed with the plan and demonstrated an understanding of the instructions.   The patient was advised to call back or seek an in-person evaluation if the symptoms worsen or if the condition fails to improve as anticipated.   BH MD OP Progress Note  04/04/2024 7:11 PM TREAZURE NERY  MRN:  [id number]  Chief Complaint:  Chief Complaint  Patient presents with   Follow-up   Anxiety   Depression   Medication Refill   History of Present Illness Susan Suarez is a 38 year old Caucasian female, married, lives in [place] has a history of bipolar disorder, GAD, attention and concentration deficit, history of alcoholism, back pain was evaluated by telemedicine today.  Discussed the use of AI scribe software for clinical note transcription with the patient, who gave verbal consent to proceed.  She experiences anxiety and stress related to work changes and [employer]. She attends therapy and a Dialectical Behavior Therapy (DBT) skills class, which she finds beneficial. She has been managing her goals related to drinking, social media use, and spending, attributing these behaviors to feelings of loneliness and tiredness.  She reports a possible recent hypomanic episode, characterized by increased work hours without feeling fatigued. She  enjoys the hypomanic state but is concerned about potential depressive episodes. She mentions impulsive spending on work events, which she wants to avoid.  She has a history of sleep disturbances, including snoring when sleeping on her back and experiencing episodes where she feels unable to move in her dreams. Her husband sometimes has to wake her during these episodes. She has been on Seroquel, which was increased to help with sleep, but it has not been very effective.  She was evaluated for ADHD, but the testing did not support this diagnosis. However, sensory processing issues related to anxiety were noted, and there was a suggestion to try Intuniv (guanfacine) to assist with emotional regulation and impulse control. She is currently on Seroquel, Cymbalta, and hydroxyzine as needed.  Her recent blood work showed an elevated A1c. She is mindful of her carbohydrate intake and is hopeful to lose some weight, although she has not gained any recently. She is 5'2" and weighs 140 pounds.     Visit Diagnosis:    ICD-10-CM   1. Bipolar disorder, in partial remission, most recent episode mixed (HCC)  F31.77 guanFACINE (INTUNIV) 1 MG TB24 ER tablet    2. GAD (generalized anxiety disorder)  F41.1 guanFACINE (INTUNIV) 1 MG TB24 ER tablet    3. History of alcoholism (HCC)  F10.21     4. Attention and concentration deficit  R41.840 guanFACINE (INTUNIV) 1 MG TB24 ER tablet    5. Snoring  R06.83 Ambulatory referral to Pulmonology      Past Psychiatric History: I have reviewed past psychiatric history from progress note on 08/20/2023.  Past trials of Zoloft, Ritalin, Concerta, Intuniv, duloxetine, Xanax.  Past Medical History:  Past Medical History:  Diagnosis Date   ADHD    Allergy    Seasonal and surgical cleanser.   Anemia 2020   First Pregnancy   Anxiety    Depression    GDM, class A2 08/14/2022   Gestational diabetes     Past Surgical History:  Procedure Laterality Date   CESAREAN  SECTION     CESAREAN SECTION N/A 10/12/2022   Procedure: CESAREAN SECTION;  Surgeon: Reva Bores, MD;  Location: [facility] LD ORS;  Service: Obstetrics;  Laterality: N/A;   COSMETIC SURGERY  2006   Mole removal on nose   WISDOM TOOTH EXTRACTION      Family Psychiatric History: I have reviewed family psychiatric history from progress note on 08/20/2023.  Family History:  Family History  Problem Relation Age of Onset   Arthritis Mother    Varicose Veins Mother    Alcohol abuse Father    Bipolar disorder Father    Diabetes Father    ADD / ADHD Father    Depression Father    Intellectual disability Sister    Varicose Veins Sister    Learning disabilities Sister    Cancer Maternal Aunt        cervical   Obesity Maternal Aunt    Varicose Veins Maternal Aunt    Cancer Maternal Uncle    Cancer Paternal Uncle    Hypertension Maternal Grandfather    Stroke Maternal Grandfather    Hypertension Maternal Grandmother    Heart disease Maternal Grandmother    Varicose Veins Maternal Grandmother    Bipolar disorder Paternal Grandfather    Hypertension Paternal Grandfather    Diabetes Paternal Grandfather    Heart disease Paternal Grandfather    Hypertension Paternal Grandmother    Bipolar disorder Other    Asthma Neg Hx     Social History: I have reviewed social history from progress note on 08/20/2023. Social History   Socioeconomic History   Marital status: Married    Spouse name: Not on file   Number of children: 2   Years of education: Not on file   Highest education level: Bachelor's degree (e.g., BA, AB, BS)  Occupational History   Not on file  Tobacco Use   Smoking status: Former    Current packs/day: 0.00    Average packs/day: 1.5 packs/day for 15.0 years (22.5 ttl pk-yrs)    Types: Cigarettes    Quit date: 09/21/2018    Years since quitting: 5.5   Smokeless tobacco: Never  Vaping Use   Vaping status: Never Used  Substance and Sexual Activity   Alcohol use: Yes     Alcohol/week: 3.0 - 5.0 standard drinks of alcohol    Types: 3 - 5 Glasses of wine per week   Drug use: Not Currently   Sexual activity: Yes    Birth control/protection: Pill  Other Topics Concern   Not on file  Social History Narrative   Not on file   Social Drivers of Health   Financial Resource Strain: Low Risk  (02/25/2024)   Overall Financial Resource Strain (CARDIA)    Difficulty of Paying Living Expenses: Not hard at all  Food Insecurity: No Food Insecurity (02/25/2024)   Hunger Vital Sign    Worried About Running Out of Food in the Last Year: Never true    Ran Out of Food in the Last Year: Never true  Transportation Needs: No Transportation Needs (02/25/2024)   PRAPARE - Transportation  Lack of Transportation (Medical): No    Lack of Transportation (Non-Medical): No  Physical Activity: Insufficiently Active (02/25/2024)   Exercise Vital Sign    Days of Exercise per Week: 2 days    Minutes of Exercise per Session: 10 min  Stress: Stress Concern Present (02/25/2024)   [employer] of Occupational Health - Occupational Stress Questionnaire    Feeling of Stress : Very much  Social Connections: Socially Isolated (02/25/2024)   Social Connection and Isolation Panel [NHANES]    Frequency of Communication with Friends and Family: Once a week    Frequency of Social Gatherings with Friends and Family: Once a week    Attends Religious Services: Never    Database administrator or Organizations: No    Attends Engineer, structural: Not on file    Marital Status: Married    Allergies:  Allergies  Allergen Reactions   Chlorhexidine Gluconate Rash    wash    Metabolic Disorder Labs: Lab Results  Component Value Date   HGBA1C 5.8 (H) 02/25/2024   MPG 116.89 08/20/2023   No results found for: "PROLACTIN" Lab Results  Component Value Date   CHOL 160 02/25/2024   TRIG 117 02/25/2024   HDL 44 02/25/2024   CHOLHDL 3.6 02/25/2024   VLDL 10 08/20/2023   LDLCALC 95  02/25/2024   LDLCALC 103 (H) 08/20/2023   Lab Results  Component Value Date   TSH 0.627 02/25/2024   TSH 1.373 08/20/2023    Therapeutic Level Labs: No results found for: "LITHIUM" No results found for: "VALPROATE" No results found for: "CBMZ"  Current Medications: Current Outpatient Medications  Medication Sig Dispense Refill   guanFACINE (INTUNIV) 1 MG TB24 ER tablet Take 1 tablet (1 mg total) by mouth daily. 30 tablet 1   DULoxetine (CYMBALTA) 60 MG capsule Take 60 mg by mouth daily.     fexofenadine (ALLEGRA) 180 MG tablet Take 180 mg by mouth daily as needed for allergies.     fluticasone (FLONASE) 50 MCG/ACT nasal spray Place into both nostrils as needed.     hydrOXYzine (ATARAX) 25 MG tablet TAKE 1 TABLET BY MOUTH 3 TIMES DAILY AS NEEDED FOR ANXIETY. (Patient taking differently: Take 25 mg by mouth as needed for anxiety.) 270 tablet 0   Multiple Vitamin (MULTIVITAMIN) tablet Take 1 tablet by mouth as needed. (Patient not taking: Reported on 02/25/2024)     OVER THE COUNTER MEDICATION once. Zenium Tranquility Blend (Patient not taking: Reported on 02/25/2024)     QUEtiapine (SEROQUEL) 100 MG tablet Take 1 tablet (100 mg total) by mouth at bedtime. 30 tablet 1   SLYND 4 MG TABS TAKE 4 MG BY MOUTH DAILY. 84 tablet 5   No current facility-administered medications for this visit.     Musculoskeletal: Strength & Muscle Tone:  UTA Gait & Station:  Seated Patient leans: N/A  Psychiatric Specialty Exam: Review of Systems  Psychiatric/Behavioral:  Positive for sleep disturbance. The patient is nervous/anxious.     not currently breastfeeding.There is no height or weight on file to calculate BMI.  General Appearance: Casual  Eye Contact:  Good  Speech:  Normal Rate  Volume:  Normal  Mood:  Anxious  Affect:  Appropriate  Thought Process:  Goal Directed and Descriptions of Associations: Intact  Orientation:  Full (Time, Place, and Person)  Thought Content: Logical   Suicidal  Thoughts:  No  Homicidal Thoughts:  No  Memory:  Immediate;   Fair Recent;   Fair  Remote;   Fair  Judgement:  Fair  Insight:  Fair  Psychomotor Activity:  Normal  Concentration:  Concentration: Fair and Attention Span: Fair  Recall:  [employer] of Knowledge: Fair  Language: Fair  Akathisia:  No  Handed:  Right  AIMS (if indicated): not done  Assets:  Desire for Improvement Housing Social Support Talents/Skills Transportation  ADL's:  Intact  Cognition: WNL  Sleep:  Poor   Screenings: AUDIT    Flowsheet Row Office Visit from 02/25/2024 in [facility] at [facility]  Alcohol Use Disorder Identification Test Final Score (AUDIT) 9       GAD-7    Flowsheet Row Office Visit from 02/25/2024 in [facility] & [facility] Medicine at [facility] Office Visit from 12/30/2023 in [facility] for [facility] at [facility] Office Visit from 08/20/2023 in [facility] Office Visit from 07/22/2023 in [facility] Primary Care & Sports Medicine at [facility] Routine Prenatal from 10/08/2022 in [facility] for [facility] at [facility]  Total GAD-7 Score 11 8 18 12 8       PHQ2-9    Flowsheet Row Office Visit from 02/25/2024 in [facility] Primary Care & Sports Medicine at [facility] Office Visit from 12/30/2023 in [facility] for [facility] at [facility] Office Visit from 08/20/2023 in [facility] Office Visit from 07/22/2023 in [facility] Primary Care & Sports Medicine at [facility] Routine Prenatal from 10/08/2022 in [facility] for [facility] at [facility]  PHQ-2 Total Score 2 2 3 2  0  PHQ-9 Total Score 11 14 20 13 4       Flowsheet Row Video Visit from 04/04/2024 in [facility] Video Visit from 03/03/2024 in [facility] Video Visit from 12/21/2023 in [facility]  C-SSRS RISK CATEGORY No Risk No Risk No Risk       LUCIANA CAMMARATA is a 38 year old Caucasian female, employed, married, lives in [place] has a history of bipolar disorder, attention concentration deficit was evaluated by telemedicine today.  Discussed assessment and plan as noted below.  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 Enjoys hypomanic states but is concerned about subsequent depressive episodes. Current episode did not involve irrational decisions or loss of control. - Continue Seroquel 100 mg daily at bedtime recently increased - Continue Cymbalta 60 mg daily - Monitor mood and behavior for signs of hypomania or depression.  Generalized anxiety disorder-improving Experiencing anxiety and stress due to work and personal life changes. Attending therapy and DBT skills class, which are beneficial. Working on managing behaviors like drinking, mindless scrolling, and overspending, often triggered by loneliness and tiredness. - Continue therapy and DBT skills class at [facility]. - Continue Hydroxyzine 25 mg 3 times a day as needed - Continue Cymbalta 60 mg daily - Start Intuniv 1 mg extended release daily.  Reviewed and discussed ADHD testing report at [facility], patient does not meet criteria for ADHD.  Follow-up Requires ongoing monitoring and follow-up for psychiatric and medical conditions. - Schedule follow-up video visit on May 29th at 1:20 PM.      Collaboration of Care: Collaboration of Care: Referral or follow-up with counselor/therapist AEB encouraged to continue DBT  Patient/Guardian was advised Release of Information must be obtained prior to any record release in order to collaborate their care with an outside provider.  Patient/Guardian was advised if they have not already done so to contact the registration department to sign all necessary forms in order for us  to release information regarding their care.   Consent: Patient/Guardian gives verbal consent for treatment and assignment of benefits for services provided during this visit. Patient/Guardian expressed understanding and agreed to proceed.  This note was generated in part or whole with voice recognition software. Voice recognition is usually quite accurate but there are transcription errors that can and very often do occur. I apologize for any typographical errors that were not detected and corrected.     Christian Treadway, MD 04/04/2024, 7:11 PM

## 2024-04-05 ENCOUNTER — Ambulatory Visit: Admitting: Sleep Medicine

## 2024-04-07 ENCOUNTER — Encounter: Payer: Self-pay | Admitting: Sleep Medicine

## 2024-04-07 ENCOUNTER — Ambulatory Visit: Admitting: Sleep Medicine

## 2024-04-07 VITALS — BP 112/72 | HR 77 | Temp 96.9°F | Ht 62.0 in | Wt 139.8 lb

## 2024-04-07 DIAGNOSIS — F5104 Psychophysiologic insomnia: Secondary | ICD-10-CM | POA: Diagnosis not present

## 2024-04-07 DIAGNOSIS — R0683 Snoring: Secondary | ICD-10-CM | POA: Diagnosis not present

## 2024-04-07 DIAGNOSIS — G4733 Obstructive sleep apnea (adult) (pediatric): Secondary | ICD-10-CM

## 2024-04-07 DIAGNOSIS — F3161 Bipolar disorder, current episode mixed, mild: Secondary | ICD-10-CM | POA: Diagnosis not present

## 2024-04-07 NOTE — Progress Notes (Signed)
 Name:Susan Suarez MRN: [id number] DOB: 1986/07/04   CHIEF COMPLAINT:  EXCESSIVE DAYTIME SLEEPINESS   HISTORY OF PRESENT ILLNESS:  Mr. Minar is a 38 y.o. w/ a h/o bipolar disorder, anxiety and depression who presents for c/o snoring, difficulty with sleep initiation, sleep maintenance and excessive daytime sleepiness which has been present for several years. Reports nocturnal awakenings due to nightmares and occasionally has difficulty falling back to sleep. Denies dream enactment. Denies any significant weight changes. Admits to dry mouth and dry mouth. Denies morning headaches, RLS symptoms, cataplexy, hypnagogic or hypnapompic hallucinations. Reports a family history of sleep apnea. Reports drowsy driving. Drinks 1 cup of coffee daily, occasional alcohol use, former smoker, denies illicit drug use.   Bedtime 8-8:30 pm Sleep onset 30-45 mins Rise time 6 am   EPWORTH SLEEP SCORE 2    04/07/2024    8:00 AM  Results of the Epworth flowsheet  Sitting and reading 0  Watching TV 0  Sitting, inactive in a public place (e.g. a theatre or a meeting) 0  As a passenger in a car for an hour without a break 0  Lying down to rest in the afternoon when circumstances permit 2  Sitting and talking to someone 0  Sitting quietly after a lunch without alcohol 0  In a car, while stopped for a few minutes in traffic 0  Total score 2     PAST MEDICAL HISTORY :   has a past medical history of ADHD, Allergy, Anemia (2020), Anxiety, Depression, GDM, class A2 (08/14/2022), and Gestational diabetes.  has a past surgical history that includes Wisdom tooth extraction; Cesarean section; Cesarean section (N/A, 10/12/2022); and Cosmetic surgery (2006). Prior to Admission medications   Medication Sig Start Date End Date Taking? Authorizing Provider  DULoxetine (CYMBALTA) 60 MG capsule Take 60 mg by mouth daily.   Yes [provider]  fexofenadine (ALLEGRA) 180 MG tablet Take 180 mg  by mouth daily as needed for allergies.   Yes [provider]  fluticasone (FLONASE) 50 MCG/ACT nasal spray Place into both nostrils as needed. 04/24/21  Yes [provider]  guanFACINE (INTUNIV) 1 MG TB24 ER tablet Take 1 tablet (1 mg total) by mouth daily. 04/04/24  Yes Jomarie Longs, MD  hydrOXYzine (ATARAX) 25 MG tablet TAKE 1 TABLET BY MOUTH 3 TIMES DAILY AS NEEDED FOR ANXIETY. Patient taking differently: Take 25 mg by mouth as needed for anxiety. 09/11/23  Yes Jomarie Longs, MD  QUEtiapine (SEROQUEL) 100 MG tablet Take 1 tablet (100 mg total) by mouth at bedtime. 03/03/24  Yes Eappen, Saramma, MD  SLYND 4 MG TABS TAKE 4 MG BY MOUTH DAILY. 03/15/24  Yes Federico Flake, MD  Multiple Vitamin (MULTIVITAMIN) tablet Take 1 tablet by mouth as needed. Patient not taking: Reported on 04/07/2024    [provider]  OVER THE COUNTER MEDICATION once. Zenium Tranquility Blend Patient not taking: Reported on 04/07/2024    [provider]   Allergies  Allergen Reactions   Chlorhexidine Gluconate Rash    wash    FAMILY HISTORY:  family history includes ADD / ADHD in her father; Alcohol abuse in her father; Arthritis in her mother; Bipolar disorder in her father, paternal grandfather, and another family member; Cancer in her maternal aunt, maternal uncle, and paternal uncle; Depression in her father; Diabetes in her father and paternal grandfather; Heart disease in her maternal grandmother and paternal grandfather; Hypertension in her maternal grandfather, maternal grandmother, paternal  grandfather, and paternal grandmother; Intellectual disability in her sister; Learning disabilities in her sister; Obesity in her maternal aunt; Stroke in her maternal grandfather; Varicose Veins in her maternal aunt, maternal grandmother, mother, and sister. SOCIAL HISTORY:  reports that she quit smoking about 5 years ago. Her smoking use included cigarettes. She has a 22.5 pack-year  smoking history. She has never used smokeless tobacco. She reports current alcohol use of about 3.0 - 5.0 standard drinks of alcohol per week. She reports that she does not currently use drugs.   Review of Systems:  Gen:  Denies  fever, sweats, chills weight loss  HEENT: Denies blurred vision, double vision, ear pain, eye pain, hearing loss, nose bleeds, sore throat Cardiac:  No dizziness, chest pain or heaviness, chest tightness,edema, No JVD Resp:   No cough, -sputum production, -shortness of breath,-wheezing, -hemoptysis,  Gi: Denies swallowing difficulty, stomach pain, nausea or vomiting, diarrhea, constipation, bowel incontinence Gu:  Denies bladder incontinence, burning urine Ext:   Denies Joint pain, stiffness or swelling Skin: Denies  skin rash, easy bruising or bleeding or hives Endoc:  Denies polyuria, polydipsia , polyphagia or weight change Psych:   Denies depression, insomnia or hallucinations  Other:  All other systems negative  VITAL SIGNS: BP 112/72 (BP Location: Right Arm, Cuff Size: Normal)   Pulse 77   Temp (!) 96.9 F (36.1 C)   Ht 5\' 2"  (1.575 m)   Wt 139 lb 12.8 oz (63.4 kg)   SpO2 97%   BMI 25.57 kg/m    Physical Examination:   General Appearance: No distress  EYES PERRLA, EOM intact.   NECK Supple, No JVD Pulmonary: normal breath sounds, No wheezing.  CardiovascularNormal S1,S2.  No m/r/g.   Abdomen: Benign, Soft, non-tender. Skin:   warm, no rashes, no ecchymosis  Extremities: normal, no cyanosis, clubbing. Neuro:without focal findings,  speech normal  PSYCHIATRIC: Mood, affect within normal limits.   ASSESSMENT AND PLAN  OSA I suspect that OSA is likely present due to clinical presentation. Discussed the consequences of untreated sleep apnea. Advised not to drive drowsy for safety of patient and others. Will complete further evaluation with a home sleep study and follow up to review results.    Bipolar disorder Stable, on current management.  Following with Psych.  Insomnia Counseled patient on stimulus control and improving sleep hygiene practices.    MEDICATION ADJUSTMENTS/LABS AND TESTS ORDERED: Recommend Sleep Study   Patient  satisfied with Plan of action and management. All questions answered  Follow up to review HST results and treatment plan.   I spent a total of 45 minutes reviewing chart data, face-to-face evaluation with the patient, counseling and coordination of care as detailed above.    Jasmin Winberry, M.D.  Sleep Medicine [facility]

## 2024-04-07 NOTE — Patient Instructions (Signed)
 Marland Kitchen

## 2024-05-08 ENCOUNTER — Other Ambulatory Visit: Payer: Self-pay | Admitting: Psychiatry

## 2024-05-08 DIAGNOSIS — F3177 Bipolar disorder, in partial remission, most recent episode mixed: Secondary | ICD-10-CM

## 2024-05-08 DIAGNOSIS — R41840 Attention and concentration deficit: Secondary | ICD-10-CM

## 2024-05-08 DIAGNOSIS — F411 Generalized anxiety disorder: Secondary | ICD-10-CM

## 2024-05-16 ENCOUNTER — Other Ambulatory Visit: Payer: Self-pay | Admitting: Psychiatry

## 2024-05-16 DIAGNOSIS — F3161 Bipolar disorder, current episode mixed, mild: Secondary | ICD-10-CM

## 2024-05-19 ENCOUNTER — Encounter: Payer: Self-pay | Admitting: Psychiatry

## 2024-05-19 ENCOUNTER — Telehealth: Admitting: Psychiatry

## 2024-05-19 DIAGNOSIS — F3178 Bipolar disorder, in full remission, most recent episode mixed: Secondary | ICD-10-CM

## 2024-05-19 DIAGNOSIS — F1021 Alcohol dependence, in remission: Secondary | ICD-10-CM

## 2024-05-19 DIAGNOSIS — F411 Generalized anxiety disorder: Secondary | ICD-10-CM

## 2024-05-19 NOTE — Progress Notes (Signed)
 Virtual Visit via Video Note  I connected with Susan Suarez on 05/19/24 at  1:20 PM EDT by a video enabled telemedicine application and verified that I am speaking with the correct person using two identifiers.  Location Provider Location : [facility] Patient Location : Car  Participants: Patient , Provider    I discussed the limitations of evaluation and management by telemedicine and the availability of in person appointments. The patient expressed understanding and agreed to proceed.   I discussed the assessment and treatment plan with the patient. The patient was provided an opportunity to ask questions and all were answered. The patient agreed with the plan and demonstrated an understanding of the instructions.   The patient was advised to call back or seek an in-person evaluation if the symptoms worsen or if the condition fails to improve as anticipated.                                                                         [facility] MD OP Progress Note  05/19/2024 9:27 PM Susan Suarez  MRN:  [id number]  Chief Complaint:  Chief Complaint  Patient presents with   Follow-up   Anxiety   Depression   Medication Refill   Discussed the use of AI scribe software for clinical note transcription with the patient, who gave verbal consent to proceed.  History of Present Illness Susan Suarez is a 38 year old caucasian female, married, lives in  [place], has a history of bipolar disorder, GAD, attention concentration deficit, history of alcoholism, back pain was evaluated by telemedicine today.  She reports she has been managing depression and manic symptoms by being more mindful about her triggers.  Her ADHD symptoms are stable and not significantly impacting her daily life. Adjustments in family dynamics, including increased support from her husband, have improved her situation.  She adheres to sleep hygiene practices, such as setting a later bedtime and engaging in  relaxing activities before bed, which have improved her sleep quality.  She continues to attend DBT skill classes at [facility] and finds hydroxyzine  helpful, though she uses it infrequently. Her anxiety is situational, often related to family relationships. She experienced a panic episode during a vacation after consuming too much sugar, which she managed without medication by resting and allowing her husband to care for the children. She is mindful of her sugar intake following a concerning A1c result and adheres to a diet limiting carbs to 40 grams three times a day with three 15-gram snacks.  She confirms having Seroquel  available and takes it as prescribed. Her previous primary care provider prescribed a 90-day supply of Cymbalta , which she is nearing the end of, and she plans to notify the office when she is close to running out.  Denies thoughts of self-harm or harm to others.    Visit Diagnosis:    ICD-10-CM   1. Bipolar disorder, in full remission, most recent episode mixed (HCC)  F31.78     2. GAD (generalized anxiety disorder)  F41.1     3. History of alcoholism (HCC)  F10.21       Past Psychiatric History: I have reviewed past psychiatric history from progress note on 08/20/2023.  Past  trials of Zoloft , Ritalin, Concerta, Intuniv , duloxetine , Xanax.  Past Medical History:  Past Medical History:  Diagnosis Date   ADHD    Allergy    Seasonal and surgical cleanser.   Anemia 2020   First Pregnancy   Anxiety    Depression    GDM, class A2 08/14/2022   Gestational diabetes     Past Surgical History:  Procedure Laterality Date   CESAREAN SECTION     CESAREAN SECTION N/A 10/12/2022   Procedure: CESAREAN SECTION;  Surgeon: Granville Layer, MD;  Location: [facility];  Service: Obstetrics;  Laterality: N/A;   COSMETIC SURGERY  2006   Mole removal on nose   WISDOM TOOTH EXTRACTION      Family Psychiatric History: I have reviewed family psychiatric history from  progress note on 08/20/2023.  Family History:  Family History  Problem Relation Age of Onset   Arthritis Mother    Varicose Veins Mother    Alcohol abuse Father    Bipolar disorder Father    Diabetes Father    ADD / ADHD Father    Depression Father    Intellectual disability Sister    Varicose Veins Sister    Learning disabilities Sister    Cancer Maternal Aunt        cervical   Obesity Maternal Aunt    Varicose Veins Maternal Aunt    Cancer Maternal Uncle    Cancer Paternal Uncle    Hypertension Maternal Grandfather    Stroke Maternal Grandfather    Hypertension Maternal Grandmother    Heart disease Maternal Grandmother    Varicose Veins Maternal Grandmother    Bipolar disorder Paternal Grandfather    Hypertension Paternal Grandfather    Diabetes Paternal Grandfather    Heart disease Paternal Grandfather    Hypertension Paternal Grandmother    Bipolar disorder Other    Asthma Neg Hx     Social History: I have reviewed social history from progress note on 08/20/2023. Social History   Socioeconomic History   Marital status: Married    Spouse name: Not on file   Number of children: 2   Years of education: Not on file   Highest education level: Bachelor's degree (e.g., BA, AB, BS)  Occupational History   Not on file  Tobacco Use   Smoking status: Former    Current packs/day: 0.00    Average packs/day: 1.5 packs/day for 15.0 years (22.5 ttl pk-yrs)    Types: Cigarettes    Quit date: 09/21/2018    Years since quitting: 5.6   Smokeless tobacco: Never  Vaping Use   Vaping status: Never Used  Substance and Sexual Activity   Alcohol use: Yes    Alcohol/week: 3.0 - 5.0 standard drinks of alcohol    Types: 3 - 5 Glasses of wine per week   Drug use: Not Currently   Sexual activity: Yes    Birth control/protection: Pill  Other Topics Concern   Not on file  Social History Narrative   Not on file   Social Drivers of Health   Financial Resource Strain: Low Risk   (02/25/2024)   Overall Financial Resource Strain (CARDIA)    Difficulty of Paying Living Expenses: Not hard at all  Food Insecurity: No Food Insecurity (02/25/2024)   Hunger Vital Sign    Worried About Running Out of Food in the Last Year: Never true    Ran Out of Food in the Last Year: Never true  Transportation Needs: No Transportation Needs (  02/25/2024)   PRAPARE - Administrator, Civil Service (Medical): No    Lack of Transportation (Non-Medical): No  Physical Activity: Insufficiently Active (02/25/2024)   Exercise Vital Sign    Days of Exercise per Week: 2 days    Minutes of Exercise per Session: 10 min  Stress: Stress Concern Present (02/25/2024)   [employer] of Occupational Health - Occupational Stress Questionnaire    Feeling of Stress : Very much  Social Connections: Socially Isolated (02/25/2024)   Social Connection and Isolation Panel [NHANES]    Frequency of Communication with Friends and Family: Once a week    Frequency of Social Gatherings with Friends and Family: Once a week    Attends Religious Services: Never    Database administrator or Organizations: No    Attends Engineer, structural: Not on file    Marital Status: Married    Allergies:  Allergies  Allergen Reactions   Chlorhexidine Gluconate Rash    wash    Metabolic Disorder Labs: Lab Results  Component Value Date   HGBA1C 5.8 (H) 02/25/2024   MPG 116.89 08/20/2023   No results found for: "PROLACTIN" Lab Results  Component Value Date   CHOL 160 02/25/2024   TRIG 117 02/25/2024   HDL 44 02/25/2024   CHOLHDL 3.6 02/25/2024   VLDL 10 08/20/2023   LDLCALC 95 02/25/2024   LDLCALC 103 (H) 08/20/2023   Lab Results  Component Value Date   TSH 0.627 02/25/2024   TSH 1.373 08/20/2023    Therapeutic Level Labs: No results found for: "LITHIUM" No results found for: "VALPROATE" No results found for: "CBMZ"  Current Medications: Current Outpatient Medications  Medication Sig  Dispense Refill   DULoxetine  (CYMBALTA ) 60 MG capsule Take 60 mg by mouth daily.     fexofenadine (ALLEGRA) 180 MG tablet Take 180 mg by mouth daily as needed for allergies.     fluticasone (FLONASE) 50 MCG/ACT nasal spray Place into both nostrils as needed.     guanFACINE  (INTUNIV ) 1 MG TB24 ER tablet TAKE 1 TABLET BY MOUTH EVERY DAY 90 tablet 0   hydrOXYzine  (ATARAX ) 25 MG tablet TAKE 1 TABLET BY MOUTH 3 TIMES DAILY AS NEEDED FOR ANXIETY. (Patient taking differently: Take 25 mg by mouth as needed for anxiety.) 270 tablet 0   Multiple Vitamin (MULTIVITAMIN) tablet Take 1 tablet by mouth as needed.     OVER THE COUNTER MEDICATION once. Zenium Tranquility Blend     QUEtiapine  (SEROQUEL ) 100 MG tablet Take 1 tablet (100 mg total) by mouth at bedtime. 30 tablet 1   SLYND  4 MG TABS TAKE 4 MG BY MOUTH DAILY. 84 tablet 5   No current facility-administered medications for this visit.     Musculoskeletal: Strength & Muscle Tone: UTA Gait & Station: Seated Patient leans: N/A  Psychiatric Specialty Exam: Review of Systems  Psychiatric/Behavioral:  Positive for sleep disturbance.        Anxious    not currently breastfeeding.There is no height or weight on file to calculate BMI.  General Appearance: Casual  Eye Contact:  Fair  Speech:  Clear and Coherent  Volume:  Normal  Mood:  Anxious  Affect:  Full Range  Thought Process:  Goal Directed and Descriptions of Associations: Intact  Orientation:  Full (Time, Place, and Person)  Thought Content: Logical   Suicidal Thoughts:  No  Homicidal Thoughts:  No  Memory:  Immediate;   Fair Recent;   Fair Remote;  Fair  Judgement:  Fair  Insight:  Fair  Psychomotor Activity:  Normal  Concentration:  Concentration: Fair and Attention Span: Fair  Recall:  [employer] of Knowledge: Fair  Language: Fair  Akathisia:  No  Handed:  Right  AIMS (if indicated): not done  Assets:  Communication Skills Desire for Improvement Housing Social  Support Talents/Skills Transportation  ADL's:  Intact  Cognition: WNL  Sleep:  improving   Screenings: AUDIT    Flowsheet Row Office Visit from 02/25/2024 in [facility] at [facility]  Alcohol Use Disorder Identification Test Final Score (AUDIT) 9       GAD-7    Flowsheet Row Office Visit from 02/25/2024 in [facility] & [facility] Medicine at [facility] Office Visit from 12/30/2023 in [facility] [address] [facility] at [facility] Office Visit from 08/20/2023 in [facility] Office Visit from 07/22/2023 in [facility] at [facility] Routine Prenatal from 10/08/2022 in [facility] for [facility] at [facility]  Total GAD-7 Score 11 8 18 12 8       PHQ2-9    Flowsheet Row Office Visit from 02/25/2024 in [facility] Medicine at [facility] Office Visit from 12/30/2023 in [facility] for [facility] at [facility] Office Visit from 08/20/2023 in [facility] Office Visit from 07/22/2023 in [facility] & [facility] Medicine at [facility] Routine Prenatal from 10/08/2022 in [facility] for [facility] at [facility]  PHQ-2 Total Score 2 2 3 2  0  PHQ-9 Total Score 11 14 20 13 4       Flowsheet Row Video Visit from 05/19/2024 in [facility] Video Visit from 04/04/2024 in [facility] Video Visit from 03/03/2024 in [facility]  C-SSRS RISK CATEGORY No Risk No Risk No Risk        Assessment and Plan: Susan Suarez is a 38 year old Caucasian female, has a history of bipolar disorder, attention, concentration deficit was evaluated by telemedicine today.  Discussed assessment and plan as noted below.  Bipolar disorder  type I mixed in remission Currently reports she is managing hypomanic/depression symptoms by identifying her triggers.  Currently medications are effective and denies side effects.  Currently able to regulate her sleep better and that has helped.  Spouse has been supportive. - Continue Seroquel  100 mg daily at bedtime - Continue Cymbalta  60 mg daily  Generalized anxiety disorder-improving Reports anxiety symptoms are improving and she continues to be motivated to attend therapy sessions. Continue therapy and DBT skill classes at [facility] counseling - Continue Cymbalta  60 mg daily - Continue Hydroxyzine  25 mg 3 times a day as needed - Continue Intuniv  1 mg extended release daily for attention.   Follow-up Follow-up in clinic in 2 to 3 months or sooner if needed.  Collaboration of Care: Collaboration of Care: Referral or follow-up with counselor/therapist AEB encouraged to continue psychotherapy sessions.  Patient/Guardian was advised Release of Information must be obtained prior to any record release in order to collaborate their care with an outside provider. Patient/Guardian was advised if they have not already done so to contact the registration department to sign all necessary forms in order for us  to release information regarding their care.   Consent: Patient/Guardian gives verbal consent for treatment and assignment  of benefits for services provided during this visit. Patient/Guardian expressed understanding and agreed to proceed.   This note was generated in part or whole with voice recognition software. Voice recognition is usually quite accurate but there are transcription errors that can and very often do occur. I apologize for any typographical errors that were not detected and corrected.    Martie Muhlbauer, MD 05/19/2024, 9:27 PM

## 2024-05-31 ENCOUNTER — Ambulatory Visit: Admitting: Sleep Medicine

## 2024-05-31 DIAGNOSIS — G4733 Obstructive sleep apnea (adult) (pediatric): Secondary | ICD-10-CM

## 2024-06-06 ENCOUNTER — Telehealth

## 2024-06-06 DIAGNOSIS — K6289 Other specified diseases of anus and rectum: Secondary | ICD-10-CM

## 2024-06-07 ENCOUNTER — Ambulatory Visit
Admission: RE | Admit: 2024-06-07 | Discharge: 2024-06-07 | Disposition: A | Discharge status: Home / Self Care | Source: Ambulatory Visit

## 2024-06-07 VITALS — BP 108/73 | HR 82 | Temp 98.5°F | Resp 16 | Wt 139.8 lb

## 2024-06-07 DIAGNOSIS — K645 Perianal venous thrombosis: Secondary | ICD-10-CM | POA: Diagnosis not present

## 2024-06-07 MED ORDER — HYDROCORTISONE ACETATE 25 MG RE SUPP: 25.0000 mg | Freq: Two times a day (BID) | RECTAL | 1 refills | Status: DC

## 2024-06-07 MED ORDER — DOCUSATE SODIUM 100 MG PO CAPS: 100.0000 mg | ORAL_CAPSULE | Freq: Every day | ORAL | 2 refills | Status: AC | PRN

## 2024-06-07 MED ORDER — HYDROCODONE-ACETAMINOPHEN 5-325 MG PO TABS: 1.0000 | ORAL_TABLET | ORAL | 0 refills | Status: DC | PRN

## 2024-06-07 NOTE — ED Triage Notes (Signed)
 I sent a message last night and was recommended to come in. - Entered by patient  Pt presents c/o hemorrhoid pain. Pt says she used some prescription Cortizone cream to help with enough relief to be able to sleep. Otherwise nothing is really giving her relief. Sxs onset Saturday and has grown in size.

## 2024-06-07 NOTE — Discharge Instructions (Addendum)
 [place] area 20 minutes 4 times a day.  Follow up with general surgery if you continue to have problems

## 2024-06-07 NOTE — ED Provider Notes (Signed)
 [facility]    CSN: [id number] Arrival date & time: 06/07/24  1058      History   Chief Complaint Chief Complaint  Patient presents with   Hemorrhoids    I sent a message last night and was recommended to come in. - Entered by patient    HPI Susan Suarez is a 38 y.o. female.   Patient complains of a swollen painful hemorrhoid.  Patient reports she has tried multiple medications without relief.  Patient denies any fever or chills.  Patient reports that she has had hemorrhoids in the past.  Patient does take Metamucil to keep bowel movements soft.  The history is provided by the patient. No language interpreter was used.    Past Medical History:  Diagnosis Date   ADHD    Allergy    Seasonal and surgical cleanser.   Anemia 2020   First Pregnancy   Anxiety    Depression    GDM, class A2 08/14/2022   Gestational diabetes     Patient Active Problem List   Diagnosis Date Noted   Snoring 04/04/2024   Bipolar 1 disorder, mixed, moderate (HCC) 04/04/2024   Bipolar disorder, in full remission, most recent episode mixed (HCC) 03/03/2024   History of alcoholism (HCC) 08/20/2023   Attention and concentration deficit 08/20/2023   Bipolar 1 disorder, mixed, mild (HCC) 08/20/2023   High risk medication use 08/20/2023   At risk for prolonged QT interval syndrome 08/20/2023   Current severe episode of major depressive disorder without psychotic features without prior episode (HCC) 07/22/2023   Prediabetes 07/22/2023   History of actinic keratosis 04/16/2022   Previous Classical Cesarean Section 04/16/2022   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 10/30/2021   Rosacea 05/14/2021   Acne vulgaris 05/14/2021   Recurrent major depressive disorder (HCC) 10/28/2017   Cervical neuropathy 04/07/2016   GAD (generalized anxiety disorder) 04/07/2016   Chronic pain of multiple sites 04/07/2016    Past Surgical History:   Procedure Laterality Date   CESAREAN SECTION     CESAREAN SECTION N/A 10/12/2022   Procedure: CESAREAN SECTION;  Surgeon: Granville Layer, MD;  Location: [facility] LD ORS;  Service: Obstetrics;  Laterality: N/A;   COSMETIC SURGERY  2006   Mole removal on nose   WISDOM TOOTH EXTRACTION      OB History     Gravida  5   Para  2   Term  2   Preterm      AB  3   Living  2      SAB  3   IAB      Ectopic      Multiple  0   Live Births  2            Home Medications    Prior to Admission medications   Medication Sig Start Date End Date Taking? Authorizing Provider  docusate sodium  (COLACE) 100 MG capsule Take 1 capsule (100 mg total) by mouth daily as needed. 06/07/24 06/07/25 Yes Krystal Teachey K, PA-C  HYDROcodone-acetaminophen  (NORCO/VICODIN) 5-325 MG tablet Take 1 tablet by mouth every 4 (four) hours as needed for moderate pain (pain score 4-6). 06/07/24  Yes Cabria Micalizzi K, PA-C  hydrocortisone  (ANUSOL -HC) 25 MG suppository Place 1 suppository (25 mg total) rectally every 12 (twelve) hours. 06/07/24 06/07/25 Yes Alianny Toelle K, PA-C  ibuprofen  (ADVIL ) 400 MG tablet Take 400 mg by mouth every 6 (six) hours  as needed.   Yes [provider]  DULoxetine  (CYMBALTA ) 60 MG capsule Take 60 mg by mouth daily.    [provider]  fexofenadine (ALLEGRA) 180 MG tablet Take 180 mg by mouth daily as needed for allergies.    [provider]  fluticasone (FLONASE) 50 MCG/ACT nasal spray Place into both nostrils as needed. 04/24/21   [provider]  guanFACINE  (INTUNIV ) 1 MG TB24 ER tablet TAKE 1 TABLET BY MOUTH EVERY DAY 05/09/24   Eappen, Saramma, MD  hydrOXYzine  (ATARAX ) 25 MG tablet TAKE 1 TABLET BY MOUTH 3 TIMES DAILY AS NEEDED FOR ANXIETY. Patient taking differently: Take 25 mg by mouth as needed for anxiety. 09/11/23   Eappen, Saramma, MD  Multiple Vitamin (MULTIVITAMIN) tablet Take 1 tablet by mouth as needed.    [provider]  OVER THE  COUNTER MEDICATION once. Zenium Tranquility Blend    [provider]  QUEtiapine  (SEROQUEL ) 100 MG tablet Take 1 tablet (100 mg total) by mouth at bedtime. 03/03/24   Eappen, Saramma, MD  SLYND  4 MG TABS TAKE 4 MG BY MOUTH DAILY. 03/15/24   Abner Ables, MD    Family History Family History  Problem Relation Age of Onset   Arthritis Mother    Varicose Veins Mother    Alcohol abuse Father    Bipolar disorder Father    Diabetes Father    ADD / ADHD Father    Depression Father    Intellectual disability Sister    Varicose Veins Sister    Learning disabilities Sister    Cancer Maternal Aunt        cervical   Obesity Maternal Aunt    Varicose Veins Maternal Aunt    Cancer Maternal Uncle    Cancer Paternal Uncle    Hypertension Maternal Grandfather    Stroke Maternal Grandfather    Hypertension Maternal Grandmother    Heart disease Maternal Grandmother    Varicose Veins Maternal Grandmother    Bipolar disorder Paternal Grandfather    Hypertension Paternal Grandfather    Diabetes Paternal Grandfather    Heart disease Paternal Grandfather    Hypertension Paternal Grandmother    Bipolar disorder Other    Asthma Neg Hx     Social History Social History   Tobacco Use   Smoking status: Former    Current packs/day: 0.00    Average packs/day: 1.5 packs/day for 15.0 years (22.5 ttl pk-yrs)    Types: Cigarettes    Quit date: 09/21/2018    Years since quitting: 5.7    Passive exposure: Past   Smokeless tobacco: Never  Vaping Use   Vaping status: Never Used  Substance Use Topics   Alcohol use: Yes    Alcohol/week: 3.0 - 5.0 standard drinks of alcohol    Types: 3 - 5 Glasses of wine per week   Drug use: Not Currently     Allergies   Chlorhexidine gluconate   Review of Systems Review of Systems  All other systems reviewed and are negative.    Physical Exam Triage Vital Signs ED Triage Vitals  Encounter Vitals Group     BP 06/07/24 1123 108/73      Girls Systolic BP Percentile --      Girls Diastolic BP Percentile --      Boys Systolic BP Percentile --      Boys Diastolic BP Percentile --      Pulse Rate 06/07/24 1123 82     Resp 06/07/24 1123 16  Temp 06/07/24 1123 98.5 F (36.9 C)     Temp Source 06/07/24 1123 Oral     SpO2 06/07/24 1123 98 %     Weight 06/07/24 1121 139 lb 12.4 oz (63.4 kg)     Height --      Head Circumference --      Peak Flow --      Pain Score 06/07/24 1117 8     Pain Loc --      Pain Education --      Exclude from Growth Chart --    No data found.  Updated Vital Signs BP 108/73 (BP Location: Left Arm)   Pulse 82   Temp 98.5 F (36.9 C) (Oral)   Resp 16   Wt 63.4 kg   LMP  (LMP Unknown)   SpO2 98%   Breastfeeding No   BMI 25.56 kg/m   Visual Acuity Right Eye Distance:   Left Eye Distance:   Bilateral Distance:    Right Eye Near:   Left Eye Near:    Bilateral Near:     Physical Exam Vitals and nursing note reviewed.  Constitutional:      Appearance: She is well-developed.  HENT:     Head: Normocephalic.   Cardiovascular:     Rate and Rhythm: Normal rate.  Pulmonary:     Effort: Pulmonary effort is normal.  Abdominal:     General: There is no distension.  Genitourinary:    Comments: 3 cm thrombosed hemorrhoid,  Musculoskeletal:        General: Normal range of motion.   Skin:    General: Skin is warm.   Neurological:     General: No focal deficit present.     Mental Status: She is alert and oriented to person, place, and time.      UC Treatments / Results  Labs (all labs ordered are listed, but only abnormal results are displayed) Labs Reviewed - No data to display  EKG   Radiology No results found.  Procedures Incision and Drainage  Date/Time: 06/07/2024 12:36 PM  Performed by: Sandi Crosby, PA-C Authorized by: Sandi Crosby, PA-C   Consent:    Consent obtained:  Verbal   Consent given by:  Patient   Risks, benefits, and alternatives were  discussed: yes     Risks discussed:  Bleeding Universal protocol:    Procedure explained and questions answered to patient or proxy's satisfaction: yes     Immediately prior to procedure, a time out was called: yes   Location:    Type:  External thrombosed hemorrhoid   Size:  3 m   Location:  Anogenital Pre-procedure details:    Skin preparation:  Povidone-iodine  Procedure type:    Complexity:  Simple Procedure details:    Incision types:  Single straight   Wound management:  Probed and deloculated   Drainage:  Bloody   Drainage amount:  Scant Post-procedure details:    Procedure completion:  Tolerated Comments:     Thrombosed blood clot removed  (including critical care time)  Medications Ordered in UC Medications - No data to display  Initial Impression / Assessment and Plan / UC Course  I have reviewed the triage vital signs and the nursing notes.  Pertinent labs & imaging results that were available during my care of the patient were reviewed by me and considered in my medical decision making (see chart for details).      Final Clinical Impressions(s) / UC Diagnoses  Final diagnoses:  Hemorrhoid thrombosis     Discharge Instructions      Soak area 20 minutes 4 times a day.  Follow up with general surgery if you continue to have problems   ED Prescriptions     Medication Sig Dispense Auth. Provider   docusate sodium  (COLACE) 100 MG capsule Take 1 capsule (100 mg total) by mouth daily as needed. 30 capsule Stormee Duda K, PA-C   hydrocortisone  (ANUSOL -HC) 25 MG suppository Place 1 suppository (25 mg total) rectally every 12 (twelve) hours. 12 suppository Philana Younis K, PA-C   HYDROcodone-acetaminophen  (NORCO/VICODIN) 5-325 MG tablet Take 1 tablet by mouth every 4 (four) hours as needed for moderate pain (pain score 4-6). 12 tablet Voris Tigert K, PA-C      I have reviewed the PDMP during this encounter. An After Visit Summary was printed and given to  the patient.       Sandi Crosby, PA-C 06/07/24 1239

## 2024-06-07 NOTE — Progress Notes (Signed)
  Because of concern for a thrombosed hemorrhoid giving severe pain, I feel your condition warrants further evaluation and I recommend that you be seen in a face-to-face visit.   NOTE: There will be NO CHARGE for this E-Visit   If you are having a true medical emergency, please call 911.     For an urgent face to face visit, [facility] has multiple urgent care centers for your convenience.  Click the link below for the full list of locations and hours, walk-in wait times, appointment scheduling options and driving directions:  Urgent Care - [place], [place], [place], [place], [place], [place]  [place]     Your MyChart E-visit questionnaire answers were reviewed by a board certified advanced clinical practitioner to complete your personal care plan based on your specific symptoms.    Thank you for using e-Visits.

## 2024-06-10 DIAGNOSIS — G4733 Obstructive sleep apnea (adult) (pediatric): Secondary | ICD-10-CM | POA: Diagnosis not present

## 2024-06-22 ENCOUNTER — Ambulatory Visit: Payer: Self-pay

## 2024-06-22 DIAGNOSIS — G4733 Obstructive sleep apnea (adult) (pediatric): Secondary | ICD-10-CM

## 2024-06-22 NOTE — Telephone Encounter (Signed)
-----   Message from [facility] D REDDY sent at 06/22/2024  3:40 PM EDT ----- Please notify patient that HST revealed mild OSA, recommend proceeding with APAP therapy set to 4-16 cm H2O, EPR 3 with the Airtouch N30i nasal mask. Please also schedule a 3 month follow up visit if  patient wishes to proceed with CPAP therapy. Thanks    ----- Message ----- From: [name] Sent: 06/13/2024   8:01 AM EDT To: Pallavi D Reddy, MD

## 2024-06-25 ENCOUNTER — Other Ambulatory Visit: Payer: Self-pay | Admitting: Psychiatry

## 2024-06-25 DIAGNOSIS — F3162 Bipolar disorder, current episode mixed, moderate: Secondary | ICD-10-CM

## 2024-07-10 ENCOUNTER — Encounter: Payer: Self-pay | Admitting: Family Medicine

## 2024-07-11 ENCOUNTER — Other Ambulatory Visit: Payer: Self-pay | Admitting: *Deleted

## 2024-07-11 DIAGNOSIS — Z3041 Encounter for surveillance of contraceptive pills: Secondary | ICD-10-CM

## 2024-07-11 MED ORDER — SLYND 4 MG PO TABS: 4.0000 mg | ORAL_TABLET | Freq: Every day | ORAL | 5 refills | Status: DC

## 2024-07-21 ENCOUNTER — Telehealth: Payer: Self-pay | Admitting: Sleep Medicine

## 2024-07-21 NOTE — Telephone Encounter (Signed)
 Dr. Jess saw this patient on 04/07/24 and place new cpap order on 06/22/24. The cpap order was sent to [facility]. We received a note from [facility] that they have made multiple attempts to schedule the cpap setup with the patient. I just spoke with Mrs. Susan Suarez and she states she keeps leaving messages but no one will call her back. Mrs. Susan Suarez even tried calling during hours that [facility] should be open and after hours

## 2024-07-26 NOTE — Telephone Encounter (Signed)
 I did speak with Tammy with [facility] and she was going to call the patient herself

## 2024-08-03 ENCOUNTER — Other Ambulatory Visit: Payer: Self-pay | Admitting: Psychiatry

## 2024-08-03 NOTE — Telephone Encounter (Signed)
 Could you contact the pharmacy to verify whether the patient has received the medication ordered by Dr. Eappen? Please update the patient once confirmed. Thanks.

## 2024-08-04 ENCOUNTER — Other Ambulatory Visit: Payer: Self-pay | Admitting: Psychiatry

## 2024-08-04 DIAGNOSIS — R41840 Attention and concentration deficit: Secondary | ICD-10-CM

## 2024-08-04 DIAGNOSIS — F3177 Bipolar disorder, in partial remission, most recent episode mixed: Secondary | ICD-10-CM

## 2024-08-04 DIAGNOSIS — F411 Generalized anxiety disorder: Secondary | ICD-10-CM

## 2024-08-09 ENCOUNTER — Other Ambulatory Visit: Payer: Self-pay | Admitting: *Deleted

## 2024-08-09 ENCOUNTER — Encounter: Payer: Self-pay | Admitting: Family Medicine

## 2024-08-09 DIAGNOSIS — Z3041 Encounter for surveillance of contraceptive pills: Secondary | ICD-10-CM

## 2024-08-09 MED ORDER — SLYND 4 MG PO TABS
4.0000 mg | ORAL_TABLET | Freq: Every day | ORAL | 5 refills | Status: AC
Start: 2024-08-09 — End: ?

## 2024-08-17 ENCOUNTER — Encounter: Payer: Self-pay | Admitting: Psychiatry

## 2024-08-17 ENCOUNTER — Ambulatory Visit (INDEPENDENT_AMBULATORY_CARE_PROVIDER_SITE_OTHER): Admitting: Psychiatry

## 2024-08-17 ENCOUNTER — Other Ambulatory Visit: Payer: Self-pay

## 2024-08-17 VITALS — BP 102/72 | HR 88 | Temp 96.9°F | Ht 62.0 in | Wt 136.6 lb

## 2024-08-17 DIAGNOSIS — D2262 Melanocytic nevi of left upper limb, including shoulder: Secondary | ICD-10-CM | POA: Insufficient documentation

## 2024-08-17 DIAGNOSIS — D225 Melanocytic nevi of trunk: Secondary | ICD-10-CM | POA: Insufficient documentation

## 2024-08-17 DIAGNOSIS — F3178 Bipolar disorder, in full remission, most recent episode mixed: Secondary | ICD-10-CM | POA: Diagnosis not present

## 2024-08-17 DIAGNOSIS — F411 Generalized anxiety disorder: Secondary | ICD-10-CM | POA: Diagnosis not present

## 2024-08-17 DIAGNOSIS — F1021 Alcohol dependence, in remission: Secondary | ICD-10-CM

## 2024-08-17 MED ORDER — QUETIAPINE FUMARATE 100 MG PO TABS
100.0000 mg | ORAL_TABLET | Freq: Every day | ORAL | 2 refills | Status: DC
Start: 2024-08-17 — End: 2024-11-08

## 2024-08-17 NOTE — Progress Notes (Unsigned)
 [facility] MD OP Progress Note  08/17/2024 3:55 PM EDMONIA Suarez  MRN:  [id number]  Chief Complaint:  Chief Complaint  Patient presents with   Follow-up   Anxiety   Depression   Medication Refill   Mood swings   Discussed the use of AI scribe software for clinical note transcription with the patient, who gave verbal consent to proceed.  History of Present Illness Susan Suarez is a 38 year old Caucasian female, married, lives in [place], has a history of bipolar disorder, GAD, attention and concentration deficit, history of alcoholism, back pain was evaluated in office today for a follow-up appointment.  She continues to participate weekly in DBT skills classes and individual therapy at [facility], and she practices coping strategies such as radical acceptance and emotion regulation skills. She finds these interventions beneficial for managing daily stressors and her relationship with her mother. She reports that redefining this relationship has helped, though her mother remains a significant trigger for emotional distress.  She describes her mood as generally stable and even-toned. She occasionally experiences periods of feeling high, which she associates with increased productivity at work. She does not identify these episodes as true mania, as she maintains sleep and does not experience negative impacts on functioning or relationships. She channels this energy into positive activities, such as cleaning, and she has not experienced recent episodes of depression that impair functioning.  She reports ongoing anxiety, though she reports improvement since the last visit. She has not needed to use hydroxyzine  25 mg three times daily for anxiety recently, but she keeps it available as needed. She continues to take Seroquel  100 mg, Cymbalta  60 mg, and guanfacine  at night, and she feels that guanfacine  helps her sleep.  She experiences passive thoughts of not wanting to wake up  however denies any active suicidality, plan or intent. She finds these thoughts less bothersome than in the past and typically experiences them when she feels very tired or stressed. She manages these thoughts using skills learned in DBT and support from her therapist.   She reports that her work situation remains busy but fulfilling, and she feels more confident and satisfied in her job. She manages her therapy and skills class schedule alongside work responsibilities, utilizing remote work days and attending skills class on Saturdays.  She was recently diagnosed with sleep apnea and is currently trying to get adjusted to CPAP mask.  Visit Diagnosis:    ICD-10-CM   1. Bipolar disorder, in full remission, most recent episode mixed (HCC)  F31.78     2. GAD (generalized anxiety disorder)  F41.1 QUEtiapine  (SEROQUEL ) 100 MG tablet    3. History of alcoholism (HCC)  F10.21       Past Psychiatric History: I have reviewed past psychiatric history from progress note on 08/20/2023.  Past trials of Zoloft , Ritalin, Concerta, Intuniv , duloxetine , Xanax  Past Medical History:  Past Medical History:  Diagnosis Date   ADHD    Allergy    Seasonal and surgical cleanser.   Anemia 2020   First Pregnancy   Anxiety    Depression    GDM, class A2 08/14/2022   Gestational diabetes     Past Surgical History:  Procedure Laterality Date   CESAREAN SECTION     CESAREAN SECTION N/A 10/12/2022   Procedure: CESAREAN SECTION;  Surgeon: Fredirick Glenys RAMAN, MD;  Location: [facility] LD ORS;  Service: Obstetrics;  Laterality: N/A;   COSMETIC SURGERY  2006   Mole removal on nose  WISDOM TOOTH EXTRACTION      Family Psychiatric History: I have reviewed family psychiatric history from progress note on 08/20/2023.  Family History:  Family History  Problem Relation Age of Onset   Arthritis Mother    Varicose Veins Mother    Alcohol abuse Father    Bipolar disorder Father    Diabetes Father    ADD / ADHD Father     Depression Father    Intellectual disability Sister    Varicose Veins Sister    Learning disabilities Sister    Cancer Maternal Aunt        cervical   Obesity Maternal Aunt    Varicose Veins Maternal Aunt    Cancer Maternal Uncle    Cancer Paternal Uncle    Hypertension Maternal Grandfather    Stroke Maternal Grandfather    Hypertension Maternal Grandmother    Heart disease Maternal Grandmother    Varicose Veins Maternal Grandmother    Bipolar disorder Paternal Grandfather    Hypertension Paternal Grandfather    Diabetes Paternal Grandfather    Heart disease Paternal Grandfather    Hypertension Paternal Grandmother    Bipolar disorder Other    Asthma Neg Hx     Social History: I have reviewed social history from progress note on 08/20/2023. Social History   Socioeconomic History   Marital status: Married    Spouse name: Not on file   Number of children: 2   Years of education: Not on file   Highest education level: Bachelor's degree (e.g., BA, AB, BS)  Occupational History   Not on file  Tobacco Use   Smoking status: Former    Current packs/day: 0.00    Average packs/day: 1.5 packs/day for 15.0 years (22.5 ttl pk-yrs)    Types: Cigarettes    Quit date: 09/21/2018    Years since quitting: 5.9    Passive exposure: Past   Smokeless tobacco: Never  Vaping Use   Vaping status: Never Used  Substance and Sexual Activity   Alcohol use: Yes    Alcohol/week: 3.0 - 5.0 standard drinks of alcohol    Types: 3 - 5 Glasses of wine per week   Drug use: Not Currently   Sexual activity: Yes    Birth control/protection: Pill  Other Topics Concern   Not on file  Social History Narrative   Not on file   Social Drivers of Health   Financial Resource Strain: Low Risk  (02/25/2024)   Overall Financial Resource Strain (CARDIA)    Difficulty of Paying Living Expenses: Not hard at all  Food Insecurity: No Food Insecurity (02/25/2024)   Hunger Vital Sign    Worried About Running Out of  Food in the Last Year: Never true    Ran Out of Food in the Last Year: Never true  Transportation Needs: No Transportation Needs (02/25/2024)   PRAPARE - Administrator, Civil Service (Medical): No    Lack of Transportation (Non-Medical): No  Physical Activity: Insufficiently Active (02/25/2024)   Exercise Vital Sign    Days of Exercise per Week: 2 days    Minutes of Exercise per Session: 10 min  Stress: Stress Concern Present (02/25/2024)   [employer] of Occupational Health - Occupational Stress Questionnaire    Feeling of Stress : Very much  Social Connections: Socially Isolated (02/25/2024)   Social Connection and Isolation Panel    Frequency of Communication with Friends and Family: Once a week    Frequency of Social Gatherings  with Friends and Family: Once a week    Attends Religious Services: Never    Database administrator or Organizations: No    Attends Engineer, structural: Not on file    Marital Status: Married    Allergies:  Allergies  Allergen Reactions   Chlorhexidine Gluconate Rash    wash    Metabolic Disorder Labs: Lab Results  Component Value Date   HGBA1C 5.8 (H) 02/25/2024   MPG 116.89 08/20/2023   No results found for: PROLACTIN Lab Results  Component Value Date   CHOL 160 02/25/2024   TRIG 117 02/25/2024   HDL 44 02/25/2024   CHOLHDL 3.6 02/25/2024   VLDL 10 08/20/2023   LDLCALC 95 02/25/2024   LDLCALC 103 (H) 08/20/2023   Lab Results  Component Value Date   TSH 0.627 02/25/2024   TSH 1.373 08/20/2023    Therapeutic Level Labs: No results found for: LITHIUM No results found for: VALPROATE No results found for: CBMZ  Current Medications: Current Outpatient Medications  Medication Sig Dispense Refill   docusate sodium  (COLACE) 100 MG capsule Take 1 capsule (100 mg total) by mouth daily as needed. 30 capsule 2   Drospirenone  (SLYND ) 4 MG TABS Take 1 tablet (4 mg total) by mouth daily. 84 tablet 5   DULoxetine   (CYMBALTA ) 60 MG capsule Take 60 mg by mouth daily.     fexofenadine (ALLEGRA) 180 MG tablet Take 180 mg by mouth daily as needed for allergies.     fluticasone (FLONASE) 50 MCG/ACT nasal spray Place into both nostrils as needed.     guanFACINE  (INTUNIV ) 1 MG TB24 ER tablet TAKE 1 TABLET BY MOUTH EVERY DAY 90 tablet 0   hydrOXYzine  (ATARAX ) 25 MG tablet TAKE 1 TABLET BY MOUTH 3 TIMES DAILY AS NEEDED FOR ANXIETY. 270 tablet 0   Multiple Vitamin (MULTIVITAMIN) tablet Take 1 tablet by mouth as needed.     OVER THE COUNTER MEDICATION once. Zenium Tranquility Blend     QUEtiapine  (SEROQUEL ) 100 MG tablet Take 1 tablet (100 mg total) by mouth at bedtime. 30 tablet 2   No current facility-administered medications for this visit.     Musculoskeletal: Strength & Muscle Tone: within normal limits Gait & Station: normal Patient leans: N/A  Psychiatric Specialty Exam: Review of Systems  Psychiatric/Behavioral:  Positive for sleep disturbance.     Blood pressure 102/72, pulse 88, temperature (!) 96.9 F (36.1 C), temperature source Temporal, height 5' 2 (1.575 m), weight 136 lb 9.6 oz (62 kg), not currently breastfeeding.Body mass index is 24.98 kg/m.  General Appearance: Casual  Eye Contact:  Fair  Speech:  Clear and Coherent  Volume:  Normal  Mood:  Euthymic  Affect:  Appropriate  Thought Process:  Goal Directed and Descriptions of Associations: Intact  Orientation:  Full (Time, Place, and Person)  Thought Content: Logical   Suicidal Thoughts:  No  Homicidal Thoughts:  No  Memory:  Immediate;   Fair Recent;   Fair Remote;   Fair  Judgement:  Fair  Insight:  Fair  Psychomotor Activity:  Normal  Concentration:  Concentration: Fair and Attention Span: Fair  Recall:  [employer] of Knowledge: Fair  Language: Fair  Akathisia:  No  Handed:  Right  AIMS (if indicated): done  Assets:  Communication Skills Desire for Improvement Housing Social Support Transportation  ADL's:  Intact   Cognition: WNL  Sleep:  varies trying to get adjusted to CPAP mask   Screenings: AIMS  Flowsheet Row Office Visit from 08/17/2024 in [facility]  AIMS Total Score 0   AUDIT    Flowsheet Row Office Visit from 02/25/2024 in [facility] at [facility]  Alcohol Use Disorder Identification Test Final Score (AUDIT) 9    GAD-7    Flowsheet Row Office Visit from 08/17/2024 in [facility] Office Visit from 02/25/2024 in [facility] at [facility] Office Visit from 12/30/2023 in [facility] Office Visit from 08/20/2023 in [facility] Office Visit from 07/22/2023 in [facility] at [facility]  Total GAD-7 Score 10 11 8 18 12    PHQ2-9    Flowsheet Row Office Visit from 08/17/2024 in [facility] Office Visit from 02/25/2024 in [facility] & Sports Medicine at [facility] Office Visit from 12/30/2023 in [facility] at [facility] Office Visit from 08/20/2023 in [facility] Office Visit from 07/22/2023 in [facility] & [facility] Medicine at [facility]  PHQ-2 Total Score 2 2 2 3 2   PHQ-9 Total Score 8 11 14 20 13    Flowsheet Row Office Visit from 08/17/2024 in [facility] UC from 06/07/2024 in [facility] at [facility]) Video Visit from 05/19/2024 in [facility]  C-SSRS RISK CATEGORY Low Risk Low Risk No Risk     Assessment and Plan: Susan Suarez is a 38 year old Caucasian female who has a history of bipolar disorder, attention concentration deficit was evaluated in office today.   Discussed assessment and plan as noted below.  Bipolar disorder type I mixed in remission Currently reports overall mood symptoms are stable and denies any hypomanic/depression symptoms. Continue Seroquel  100 mg at bedtime Continue Cymbalta  60 mg daily  Generalized anxiety disorder-improving Currently denies any significant anxiety symptoms and working on strategies to cope with situational anxiety Continue DBT skill classes at [facility] counseling and continue individual therapy Continue Cymbalta  60 mg daily Continue Hydroxyzine  25 mg 3 times a day as needed Continue Intuniv  1 mg extended release for attention.  Follow-up Follow-up in clinic in 3 months or sooner if needed.   Collaboration of Care: Collaboration of Care: Referral or follow-up with counselor/therapist AEB encouraged to continue DBT skill groups and individual therapy.  Patient/Guardian was advised Release of Information must be obtained prior to any record release in order to collaborate their care with an outside provider. Patient/Guardian was advised if they have not already done so to contact the registration department to sign all necessary forms in order for us  to release information regarding their care.   Consent: Patient/Guardian gives verbal consent for treatment and assignment of benefits for services provided during this visit. Patient/Guardian expressed understanding and agreed to proceed.   This note was generated in part or whole with voice recognition software. Voice recognition is usually quite accurate but there are transcription errors that can and very often do occur. I apologize for any typographical errors that were not detected and corrected.    Lamoine Fredricksen, MD 08/19/2024, 6:02 AM

## 2024-08-30 ENCOUNTER — Ambulatory Visit: Admitting: Sleep Medicine

## 2024-08-30 ENCOUNTER — Encounter: Payer: Self-pay | Admitting: Sleep Medicine

## 2024-08-30 VITALS — BP 110/60 | HR 81 | Temp 98.2°F | Ht 62.0 in | Wt 136.0 lb

## 2024-08-30 DIAGNOSIS — G4733 Obstructive sleep apnea (adult) (pediatric): Secondary | ICD-10-CM | POA: Diagnosis not present

## 2024-08-30 DIAGNOSIS — F3161 Bipolar disorder, current episode mixed, mild: Secondary | ICD-10-CM

## 2024-08-30 NOTE — Progress Notes (Signed)
 Name:Susan Suarez MRN: [id number] DOB: 1985/12/29   CHIEF COMPLAINT:  CPAP F/U   HISTORY OF PRESENT ILLNESS: Susan Suarez is a 38 y.o. w/ a h/o OSA, bipolar disorder, anxiety and depression who presents for CPAP follow up visit. Reports difficulty using CPAP therapy due to still adjusting to CPAP therapy. She is currently using the Airfit F20 FFM, which is more comfortable than the last mask she used.    EPWORTH SLEEP SCORE    04/07/2024    8:00 AM  Results of the Epworth flowsheet  Sitting and reading 0  Watching TV 0  Sitting, inactive in a public place (e.g. a theatre or a meeting) 0  As a passenger in a car for an hour without a break 0  Lying down to rest in the afternoon when circumstances permit 2  Sitting and talking to someone 0  Sitting quietly after a lunch without alcohol 0  In a car, while stopped for a few minutes in traffic 0  Total score 2    PAST MEDICAL HISTORY :   has a past medical history of ADHD, Allergy, Anemia (2020), Anxiety, Depression, GDM, class A2 (08/14/2022), and Gestational diabetes.  has a past surgical history that includes Wisdom tooth extraction; Cesarean section; Cesarean section (N/A, 10/12/2022); and Cosmetic surgery (2006). Prior to Admission medications   Medication Sig Start Date End Date Taking? Authorizing Provider  docusate sodium  (COLACE) 100 MG capsule Take 1 capsule (100 mg total) by mouth daily as needed. 06/07/24 06/07/25 Yes Flint Raring K, PA-C  Drospirenone  (SLYND ) 4 MG TABS Take 1 tablet (4 mg total) by mouth daily. 08/09/24  Yes Fredirick Glenys RAMAN, MD  DULoxetine  (CYMBALTA ) 60 MG capsule Take 60 mg by mouth daily.   Yes [provider]  fexofenadine (ALLEGRA) 180 MG tablet Take 180 mg by mouth daily as needed for allergies.   Yes [provider]  fluticasone (FLONASE) 50 MCG/ACT nasal spray Place into both nostrils as needed. 04/24/21  Yes [provider]  guanFACINE  (INTUNIV ) 1 MG TB24 ER  tablet TAKE 1 TABLET BY MOUTH EVERY DAY 08/04/24  Yes Hisada, Reina, MD  hydrOXYzine  (ATARAX ) 25 MG tablet TAKE 1 TABLET BY MOUTH 3 TIMES DAILY AS NEEDED FOR ANXIETY. 09/11/23  Yes Eappen, Saramma, MD  Multiple Vitamin (MULTIVITAMIN) tablet Take 1 tablet by mouth as needed.   Yes [provider]  OVER THE COUNTER MEDICATION once. Zenium Tranquility Blend   Yes [provider]  QUEtiapine  (SEROQUEL ) 100 MG tablet Take 1 tablet (100 mg total) by mouth at bedtime. 08/17/24  Yes Eappen, Saramma, MD   Allergies  Allergen Reactions   Chlorhexidine Gluconate Rash    wash    FAMILY HISTORY:  family history includes ADD / ADHD in her father; Alcohol abuse in her father; Arthritis in her mother; Bipolar disorder in her father, paternal grandfather, and another family member; Cancer in her maternal aunt, maternal uncle, and paternal uncle; Depression in her father; Diabetes in her father and paternal grandfather; Heart disease in her maternal grandmother and paternal grandfather; Hypertension in her maternal grandfather, maternal grandmother, paternal grandfather, and paternal grandmother; Intellectual disability in her sister; Learning disabilities in her sister; Obesity in her maternal aunt; Stroke in her maternal grandfather; Varicose Veins in her maternal aunt, maternal grandmother, mother, and sister. SOCIAL HISTORY:  reports that she quit smoking about 5 years ago. Her smoking use included cigarettes. She has a 22.5 pack-year smoking history. She has  been exposed to tobacco smoke. She has never used smokeless tobacco. She reports current alcohol use of about 3.0 - 5.0 standard drinks of alcohol per week. She reports that she does not currently use drugs.   Review of Systems:  Gen:  Denies  fever, sweats, chills weight loss  HEENT: Denies blurred vision, double vision, ear pain, eye pain, hearing loss, nose bleeds, sore throat Cardiac:  No dizziness, chest pain or heaviness, chest  tightness,edema, No JVD Resp:   No cough, -sputum production, -shortness of breath,-wheezing, -hemoptysis,  Gi: Denies swallowing difficulty, stomach pain, nausea or vomiting, diarrhea, constipation, bowel incontinence Gu:  Denies bladder incontinence, burning urine Ext:   Denies Joint pain, stiffness or swelling Skin: Denies  skin rash, easy bruising or bleeding or hives Endoc:  Denies polyuria, polydipsia , polyphagia or weight change Psych:   Denies depression, insomnia or hallucinations  Other:  All other systems negative  VITAL SIGNS: BP 110/60   Pulse 81   Temp 98.2 F (36.8 C)   Ht 5' 2 (1.575 m)   Wt 136 lb (61.7 kg)   LMP  (LMP Unknown)   SpO2 98%   Breastfeeding No   BMI 24.87 kg/m    Physical Examination:   General Appearance: No distress  EYES PERRLA, EOM intact.   NECK Supple, No JVD Pulmonary: normal breath sounds, No wheezing.  CardiovascularNormal S1,S2.  No m/r/g.   Abdomen: Benign, Soft, non-tender. Skin:   warm, no rashes, no ecchymosis  Extremities: normal, no cyanosis, clubbing. Neuro:without focal findings,  speech normal  PSYCHIATRIC: Mood, affect within normal limits.   ASSESSMENT AND PLAN  OSA Counseled patient on increasing CPAP compliance. Discussed the consequences of untreated sleep apnea. Advised not to drive drowsy for safety of patient and others. Will follow up in 3 months.     Bipolar disorder Stable, on current management. Following with psychiatry.    Patient  satisfied with Plan of action and management. All questions answered  I spent a total of 30 minutes reviewing chart data, face-to-face evaluation with the patient, counseling and coordination of care as detailed above.    Vikkie Goeden, M.D.  Sleep Medicine [facility]

## 2024-08-30 NOTE — Patient Instructions (Signed)

## 2024-11-08 ENCOUNTER — Encounter: Payer: Self-pay | Admitting: Psychiatry

## 2024-11-08 ENCOUNTER — Telehealth: Admitting: Psychiatry

## 2024-11-08 DIAGNOSIS — F411 Generalized anxiety disorder: Secondary | ICD-10-CM

## 2024-11-08 DIAGNOSIS — F1021 Alcohol dependence, in remission: Secondary | ICD-10-CM

## 2024-11-08 DIAGNOSIS — F3178 Bipolar disorder, in full remission, most recent episode mixed: Secondary | ICD-10-CM | POA: Diagnosis not present

## 2024-11-08 MED ORDER — GUANFACINE HCL ER 1 MG PO TB24: 1.0000 mg | ORAL_TABLET | Freq: Every day | ORAL | 1 refills | Status: AC

## 2024-11-08 MED ORDER — QUETIAPINE FUMARATE 100 MG PO TABS
100.0000 mg | ORAL_TABLET | Freq: Every day | ORAL | 2 refills | Status: AC
Start: 2024-11-08 — End: ?

## 2024-11-08 NOTE — Progress Notes (Signed)
 Virtual Visit via Video Note  I connected with Susan Suarez on 11/08/24 at 11:00 AM EST by a video enabled telemedicine application and verified that I am speaking with the correct person using two identifiers.  Location Provider Location : [facility] Patient Location : Home  Participants: Patient , Provider    I discussed the limitations of evaluation and management by telemedicine and the availability of in person appointments. The patient expressed understanding and agreed to proceed.   I discussed the assessment and treatment plan with the patient. The patient was provided an opportunity to ask questions and all were answered. The patient agreed with the plan and demonstrated an understanding of the instructions.   The patient was advised to call back or seek an in-person evaluation if the symptoms worsen or if the condition fails to improve as anticipated.   [facility] MD OP Progress Note  11/08/2024 11:31 AM Susan Suarez  MRN:  [id number]  Chief Complaint:  Chief Complaint  Patient presents with   Follow-up   Anxiety   Depression   Medication Refill   Discussed the use of AI scribe software for clinical note transcription with the patient, who gave verbal consent to proceed.  History of Present Illness Susan Suarez is a 38 year old Caucasian female, married, lives in [place], has a history of bipolar disorder, GAD, attention concentration deficit, history of alcoholism, back pain was evaluated by telemedicine today.  Overall, she reports doing well and continues to participate in weekly individual therapy and skills class, having started another round. Ongoing use of coping strategies helps her address recent lifestyle changes, specifically financial adjustments after purchasing a new vehicle. She notes that these changes have led to increased monotony and reduced spontaneity in her daily life, and she has been brainstorming ways to incorporate more enjoyable  activities without additional spending. She identifies a lack of motivation as a current challenge, often defaulting to passive activities such as watching TV after work. Previously, she relied on work as a gaffer, which she recognizes can increase her stress. She has resumed practicing mindfulness and meditation techniques to help manage stress and improve her presence in daily activities.  She reports that her sleep has been somewhat disrupted recently due to her daughter coming into bed at night, and she occasionally sleeps on the couch for better rest. She does not feel excessively tired during the day.  She does have a history of sleep apnea and has trouble using CPAP mask.  She has upcoming appointment with pulmonology.  She experiences rare, brief episodes of involuntary muscle spasms or twitches in her leg or arm, which she reports have occurred about 7 times since her last visit, are not daily, and do not impact her functioning. She currently takes Seroquel  100 mg at bedtime, Cymbalta  60 mg daily, hydroxyzine  25 mg three times a day as needed, and Intuniv  1 mg extended release for attention.  She denies any thoughts of hurting herself or others.   Visit Diagnosis:    ICD-10-CM   1. Bipolar disorder, in full remission, most recent episode mixed  F31.78     2. GAD (generalized anxiety disorder)  F41.1 guanFACINE  (INTUNIV ) 1 MG TB24 ER tablet    QUEtiapine  (SEROQUEL ) 100 MG tablet    3. History of alcoholism (HCC)  F10.21       Past Psychiatric History: I have reviewed past psychiatric history from progress note on 08/20/2023.  Past trials of Zoloft , Ritalin, Concerta, Intuniv , duloxetine , Xanax  Past Medical History:  Past Medical History:  Diagnosis Date   ADHD    Allergy    Seasonal and surgical cleanser.   Anemia 2020   First Pregnancy   Anxiety    Depression    GDM, class A2 08/14/2022   Gestational diabetes     Past Surgical History:  Procedure Laterality Date    CESAREAN SECTION     CESAREAN SECTION N/A 10/12/2022   Procedure: CESAREAN SECTION;  Surgeon: Fredirick Glenys RAMAN, MD;  Location: [facility];  Service: Obstetrics;  Laterality: N/A;   COSMETIC SURGERY  2006   Mole removal on nose   WISDOM TOOTH EXTRACTION      Family Psychiatric History: I have reviewed family psychiatric history from progress note on 08/20/2023  Family History:  Family History  Problem Relation Age of Onset   Arthritis Mother    Varicose Veins Mother    Alcohol abuse Father    Bipolar disorder Father    Diabetes Father    ADD / ADHD Father    Depression Father    Intellectual disability Sister    Varicose Veins Sister    Learning disabilities Sister    Cancer Maternal Aunt        cervical   Obesity Maternal Aunt    Varicose Veins Maternal Aunt    Cancer Maternal Uncle    Cancer Paternal Uncle    Hypertension Maternal Grandfather    Stroke Maternal Grandfather    Hypertension Maternal Grandmother    Heart disease Maternal Grandmother    Varicose Veins Maternal Grandmother    Bipolar disorder Paternal Grandfather    Hypertension Paternal Grandfather    Diabetes Paternal Grandfather    Heart disease Paternal Grandfather    Hypertension Paternal Grandmother    Bipolar disorder Other    Asthma Neg Hx     Social History: I have reviewed social history from progress note on 08/20/2023 Social History   Socioeconomic History   Marital status: Married    Spouse name: Not on file   Number of children: 2   Years of education: Not on file   Highest education level: Bachelor's degree (e.g., BA, AB, BS)  Occupational History   Not on file  Tobacco Use   Smoking status: Former    Current packs/day: 0.00    Average packs/day: 1.5 packs/day for 15.0 years (22.5 ttl pk-yrs)    Types: Cigarettes    Quit date: 09/21/2018    Years since quitting: 6.1    Passive exposure: Past   Smokeless tobacco: Never  Vaping Use   Vaping status: Never Used  Substance and  Sexual Activity   Alcohol use: Yes    Alcohol/week: 3.0 - 5.0 standard drinks of alcohol    Types: 3 - 5 Glasses of wine per week   Drug use: Not Currently   Sexual activity: Yes    Birth control/protection: Pill  Other Topics Concern   Not on file  Social History Narrative   Not on file   Social Drivers of Health   Financial Resource Strain: Low Risk  (02/25/2024)   Overall Financial Resource Strain (CARDIA)    Difficulty of Paying Living Expenses: Not hard at all  Food Insecurity: No Food Insecurity (02/25/2024)   Hunger Vital Sign    Worried About Running Out of Food in the Last Year: Never true    Ran Out of Food in the Last Year: Never true  Transportation Needs: No Transportation Needs (02/25/2024)  PRAPARE - Administrator, Civil Service (Medical): No    Lack of Transportation (Non-Medical): No  Physical Activity: Insufficiently Active (02/25/2024)   Exercise Vital Sign    Days of Exercise per Week: 2 days    Minutes of Exercise per Session: 10 min  Stress: Stress Concern Present (02/25/2024)   [employer] of Occupational Health - Occupational Stress Questionnaire    Feeling of Stress : Very much  Social Connections: Socially Isolated (02/25/2024)   Social Connection and Isolation Panel    Frequency of Communication with Friends and Family: Once a week    Frequency of Social Gatherings with Friends and Family: Once a week    Attends Religious Services: Never    Database Administrator or Organizations: No    Attends Engineer, Structural: Not on file    Marital Status: Married    Allergies:  Allergies  Allergen Reactions   Chlorhexidine Gluconate Rash    wash    Metabolic Disorder Labs: Lab Results  Component Value Date   HGBA1C 5.8 (H) 02/25/2024   MPG 116.89 08/20/2023   No results found for: PROLACTIN Lab Results  Component Value Date   CHOL 160 02/25/2024   TRIG 117 02/25/2024   HDL 44 02/25/2024   CHOLHDL 3.6 02/25/2024   VLDL  10 08/20/2023   LDLCALC 95 02/25/2024   LDLCALC 103 (H) 08/20/2023   Lab Results  Component Value Date   TSH 0.627 02/25/2024   TSH 1.373 08/20/2023    Therapeutic Level Labs: No results found for: LITHIUM No results found for: VALPROATE No results found for: CBMZ  Current Medications: Current Outpatient Medications  Medication Sig Dispense Refill   docusate sodium  (COLACE) 100 MG capsule Take 1 capsule (100 mg total) by mouth daily as needed. 30 capsule 2   Drospirenone  (SLYND ) 4 MG TABS Take 1 tablet (4 mg total) by mouth daily. 84 tablet 5   DULoxetine  (CYMBALTA ) 60 MG capsule Take 60 mg by mouth daily.     fexofenadine (ALLEGRA) 180 MG tablet Take 180 mg by mouth daily as needed for allergies.     fluticasone (FLONASE) 50 MCG/ACT nasal spray Place into both nostrils as needed.     guanFACINE  (INTUNIV ) 1 MG TB24 ER tablet Take 1 tablet (1 mg total) by mouth daily. 90 tablet 1   hydrOXYzine  (ATARAX ) 25 MG tablet TAKE 1 TABLET BY MOUTH 3 TIMES DAILY AS NEEDED FOR ANXIETY. 270 tablet 0   Multiple Vitamin (MULTIVITAMIN) tablet Take 1 tablet by mouth as needed.     OVER THE COUNTER MEDICATION once. Zenium Tranquility Blend     QUEtiapine  (SEROQUEL ) 100 MG tablet Take 1 tablet (100 mg total) by mouth at bedtime. 30 tablet 2   No current facility-administered medications for this visit.     Musculoskeletal: Strength & Muscle Tone: UTA Gait & Station: Seated Patient leans: N/A  Psychiatric Specialty Exam: Review of Systems  Psychiatric/Behavioral: Negative.      There were no vitals taken for this visit.There is no height or weight on file to calculate BMI.  General Appearance: Fairly Groomed  Eye Contact:  Fair  Speech:  Clear and Coherent  Volume:  Normal  Mood:  Euthymic  Affect:  Appropriate  Thought Process:  Goal Directed and Descriptions of Associations: Intact  Orientation:  Full (Time, Place, and Person)  Thought Content: Logical   Suicidal Thoughts:  No   Homicidal Thoughts:  No  Memory:  Immediate;  Fair Recent;   Fair Remote;   Fair  Judgement:  Fair  Insight:  Fair  Psychomotor Activity:  Normal  Concentration:  Concentration: Fair and Attention Span: Fair  Recall:  [employer] of Knowledge: Fair  Language: Fair  Akathisia:  No  Handed:  Right  AIMS (if indicated): not done  Assets:  Communication Skills Desire for Improvement Housing Intimacy Social Support Talents/Skills Transportation  ADL's:  Intact  Cognition: WNL  Sleep:  varies , due to having child who sleeps on the same bed   Screenings: AIMS    Flowsheet Row Office Visit from 08/17/2024 in [facility]  AIMS Total Score 0   AUDIT    Flowsheet Row Office Visit from 02/25/2024 in [facility] at [facility]  Alcohol Use Disorder Identification Test Final Score (AUDIT) 9    GAD-7    Flowsheet Row Office Visit from 08/17/2024 in [facility] Office Visit from 02/25/2024 in [facility] Medicine at [facility] Office Visit from 12/30/2023 in [facility] at [facility] Office Visit from 08/20/2023 in [facility] Office Visit from 07/22/2023 in [facility] Care & Sports Medicine at [facility]  Total GAD-7 Score 10 11 8 18 12    PHQ2-9    Flowsheet Row Office Visit from 08/17/2024 in [facility] Office Visit from 02/25/2024 in [facility] Primary Care & [facility] Medicine at [facility] Office Visit from 12/30/2023 in [facility] for [facility] at [facility] Office Visit from 08/20/2023 in [facility] Office Visit from 07/22/2023 in [facility] Medicine at [facility]  PHQ-2 Total Score 2 2 2 3 2   PHQ-9 Total Score 8 11 14 20 13     Flowsheet Row Video Visit from 11/08/2024 in [facility] Office Visit from 08/17/2024 in [facility] UC from 06/07/2024 in [facility] at [facility])  C-SSRS RISK CATEGORY No Risk Low Risk Low Risk     Assessment and Plan: Susan Suarez is a 38 year old Caucasian female who presented for a follow-up appointment, discussed assessment and plan as noted below.  1. Bipolar disorder, in full remission, most recent episode mixed Currently denies any significant mood lability.  Denies any mania/hypomania or depression symptoms.  Not interested in trial of Cogentin for muscle spasms at this time.  She will reach out with any concerns Continue Seroquel  100 mg at bedtime Continue Cymbalta  60 mg daily  2. GAD (generalized anxiety disorder)-stable Managing anxiety well, making use of psychotherapy sessions. Continue DBT skill classes at [facility] counseling and continue individual therapy. Continue Cymbalta  as prescribed Continue Hydroxyzine  25 mg 3 times a day as needed Continue Intuniv  1 mg extended release for attention and focus problems.  3. History of alcoholism (HCC) Will monitor closely.  Follow-up Follow-up in clinic in 3 months or sooner if needed.  Collaboration of Care: Collaboration of Care: Referral or follow-up with counselor/therapist AEB encouraged to continue psychotherapy sessions  Patient/Guardian was advised Release of Information must be obtained prior to any record release in order to collaborate their care with an outside provider. Patient/Guardian was advised if they have not already done so to contact the registration department to sign all necessary forms in order for us  to release  information regarding their care.   Consent: Patient/Guardian gives verbal consent for treatment and assignment of benefits for services provided during this visit.  Patient/Guardian expressed understanding and agreed to proceed.   This note was generated in part or whole with voice recognition software. Voice recognition is usually quite accurate but there are transcription errors that can and very often do occur. I apologize for any typographical errors that were not detected and corrected.    Arel Tippen, MD 11/09/2024, 7:13 AM

## 2024-11-29 ENCOUNTER — Ambulatory Visit: Admitting: Sleep Medicine

## 2024-12-06 ENCOUNTER — Ambulatory Visit: Admitting: Sleep Medicine

## 2024-12-08 ENCOUNTER — Ambulatory Visit: Admitting: Sleep Medicine

## 2024-12-08 VITALS — BP 110/60 | HR 79 | Temp 98.5°F | Ht 62.0 in | Wt 131.0 lb

## 2024-12-08 DIAGNOSIS — G4733 Obstructive sleep apnea (adult) (pediatric): Secondary | ICD-10-CM

## 2024-12-08 DIAGNOSIS — F3161 Bipolar disorder, current episode mixed, mild: Secondary | ICD-10-CM | POA: Diagnosis not present

## 2024-12-08 NOTE — Progress Notes (Signed)
 Name:Susan Suarez MRN: [id number] DOB: [birth year]   CHIEF COMPLAINT:  OSA F/U   HISTORY OF PRESENT ILLNESS: Ms. Leech is a 38 y.o. w/ a h/o OSA, bipolar disorder, anxiety and depression who presents for CPAP follow up visit. Reports difficulty using CPAP therapy due to mask discomfort and would like to discontinue CPAP therapy. Also reports a 10 lb weight loss over the last few months. States that her sleep has improved significantly.    EPWORTH SLEEP SCORE    04/07/2024    8:00 AM  Results of the Epworth flowsheet  Sitting and reading 0  Watching TV 0  Sitting, inactive in a public place (e.g. a theatre or a meeting) 0  As a passenger in a car for an hour without a break 0  Lying down to rest in the afternoon when circumstances permit 2  Sitting and talking to someone 0  Sitting quietly after a lunch without alcohol 0  In a car, while stopped for a few minutes in traffic 0  Total score 2    PAST MEDICAL HISTORY :   has a past medical history of ADHD, Allergy, Anemia (2020), Anxiety, Depression, GDM, class A2 (08/14/2022), and Gestational diabetes.  has a past surgical history that includes Wisdom tooth extraction; Cesarean section; Cesarean section (N/A, 10/12/2022); and Cosmetic surgery (2006). Prior to Admission medications   Medication Sig Start Date End Date Taking? Authorizing Provider  docusate sodium  (COLACE) 100 MG capsule Take 1 capsule (100 mg total) by mouth daily as needed. 06/07/24 06/07/25 Yes Flint Raring K, PA-C  Drospirenone  (SLYND ) 4 MG TABS Take 1 tablet (4 mg total) by mouth daily. 08/09/24  Yes Fredirick Glenys RAMAN, MD  DULoxetine  (CYMBALTA ) 60 MG capsule Take 60 mg by mouth daily.   Yes [provider]  fexofenadine (ALLEGRA) 180 MG tablet Take 180 mg by mouth daily as needed for allergies.   Yes [provider]  fluticasone (FLONASE) 50 MCG/ACT nasal spray Place into both nostrils as needed. 04/24/21  Yes [provider]   guanFACINE  (INTUNIV ) 1 MG TB24 ER tablet TAKE 1 TABLET BY MOUTH EVERY DAY 08/04/24  Yes Hisada, Reina, MD  hydrOXYzine  (ATARAX ) 25 MG tablet TAKE 1 TABLET BY MOUTH 3 TIMES DAILY AS NEEDED FOR ANXIETY. 09/11/23  Yes Eappen, Saramma, MD  Multiple Vitamin (MULTIVITAMIN) tablet Take 1 tablet by mouth as needed.   Yes [provider]  OVER THE COUNTER MEDICATION once. Zenium Tranquility Blend   Yes [provider]  QUEtiapine  (SEROQUEL ) 100 MG tablet Take 1 tablet (100 mg total) by mouth at bedtime. 08/17/24  Yes Eappen, Saramma, MD   Allergies  Allergen Reactions   Chlorhexidine Gluconate Rash    wash    FAMILY HISTORY:  family history includes ADD / ADHD in her father; Alcohol abuse in her father; Arthritis in her mother; Bipolar disorder in her father, paternal grandfather, and another family member; Cancer in her maternal aunt, maternal uncle, and paternal uncle; Depression in her father; Diabetes in her father and paternal grandfather; Heart disease in her maternal grandmother and paternal grandfather; Hypertension in her maternal grandfather, maternal grandmother, paternal grandfather, and paternal grandmother; Intellectual disability in her sister; Learning disabilities in her sister; Obesity in her maternal aunt; Stroke in her maternal grandfather; Varicose Veins in her maternal aunt, maternal grandmother, mother, and sister. SOCIAL HISTORY:  reports that she quit smoking about 6 years ago. Her smoking use included cigarettes. She has a 22.5  pack-year smoking history. She has been exposed to tobacco smoke. She has never used smokeless tobacco. She reports current alcohol use of about 3.0 - 5.0 standard drinks of alcohol per week. She reports that she does not currently use drugs.   Review of Systems:  Gen:  Denies  fever, sweats, chills weight loss  HEENT: Denies blurred vision, double vision, ear pain, eye pain, hearing loss, nose bleeds, sore throat Cardiac:  No dizziness,  chest pain or heaviness, chest tightness,edema, No JVD Resp:   No cough, -sputum production, -shortness of breath,-wheezing, -hemoptysis,  Gi: Denies swallowing difficulty, stomach pain, nausea or vomiting, diarrhea, constipation, bowel incontinence Gu:  Denies bladder incontinence, burning urine Ext:   Denies Joint pain, stiffness or swelling Skin: Denies  skin rash, easy bruising or bleeding or hives Endoc:  Denies polyuria, polydipsia , polyphagia or weight change Psych:   Denies depression, insomnia or hallucinations  Other:  All other systems negative  VITAL SIGNS: BP 110/60   Pulse 79   Temp 98.5 F (36.9 C)   Ht 5' 2 (1.575 m)   Wt 131 lb (59.4 kg)   LMP  (LMP Unknown)   SpO2 97%   BMI 23.96 kg/m    Physical Examination:   General Appearance: No distress  EYES PERRLA, EOM intact.   NECK Supple, No JVD Pulmonary: normal breath sounds, No wheezing.  CardiovascularNormal S1,S2.  No m/r/g.   Abdomen: Benign, Soft, non-tender. Skin:   warm, no rashes, no ecchymosis  Extremities: normal, no cyanosis, clubbing. Neuro:without focal findings,  speech normal  PSYCHIATRIC: Mood, affect within normal limits.   ASSESSMENT AND PLAN  OSA Advised patient to continue working on weight loss. Due to patient having mild OSA initially and then having significant weight loss, will discontinue CPAP therapy. Discussed the consequences of untreated sleep apnea. Advised not to drive drowsy for safety of patient and others. Will follow up as needed.      Bipolar disorder Stable, on current management. Following with psychiatry.    Patient  satisfied with Plan of action and management. All questions answered  I spent a total of 23 minutes reviewing chart data, face-to-face evaluation with the patient, counseling and coordination of care as detailed above.    Ahmarion Saraceno, M.D.  Sleep Medicine [facility]

## 2024-12-28 ENCOUNTER — Telehealth: Payer: Self-pay | Admitting: Sleep Medicine

## 2024-12-28 DIAGNOSIS — G4733 Obstructive sleep apnea (adult) (pediatric): Secondary | ICD-10-CM

## 2024-12-28 DIAGNOSIS — F5104 Psychophysiologic insomnia: Secondary | ICD-10-CM

## 2024-12-28 NOTE — Telephone Encounter (Signed)
 It probably wouldn't hurt to do that. It will show the patient was advised

## 2024-12-28 NOTE — Telephone Encounter (Signed)
 D/C order placed with noted documentation.

## 2024-12-28 NOTE — Telephone Encounter (Signed)
 Dr. Jess we have received a note from [facility]  AMA Return Notification. Patient has returned her cpap equipment against medical advice. Patient stated her Pulmonary provider stated the machine was no longer needed

## 2025-01-17 ENCOUNTER — Telehealth: Admitting: Psychiatry

## 2025-01-17 ENCOUNTER — Encounter: Payer: Self-pay | Admitting: Psychiatry

## 2025-01-17 DIAGNOSIS — F3178 Bipolar disorder, in full remission, most recent episode mixed: Secondary | ICD-10-CM | POA: Diagnosis not present

## 2025-01-17 DIAGNOSIS — F1021 Alcohol dependence, in remission: Secondary | ICD-10-CM | POA: Diagnosis not present

## 2025-01-17 DIAGNOSIS — F411 Generalized anxiety disorder: Secondary | ICD-10-CM

## 2025-01-17 MED ORDER — DULOXETINE HCL 60 MG PO CPEP: 60.0000 mg | ORAL_CAPSULE | Freq: Every day | ORAL | 1 refills | Status: AC

## 2025-01-17 NOTE — Progress Notes (Signed)
 Virtual Visit via Video Note  I connected with Susan Suarez on 01/17/25 at 11:00 AM EST by a video enabled telemedicine application and verified that I am speaking with the correct person using two identifiers.  Location Provider Location : [facility] Patient Location : Home  Participants: Patient , Provider    I discussed the limitations of evaluation and management by telemedicine and the availability of in person appointments. The patient expressed understanding and agreed to proceed.   I discussed the assessment and treatment plan with the patient. The patient was provided an opportunity to ask questions and all were answered. The patient agreed with the plan and demonstrated an understanding of the instructions.   The patient was advised to call back or seek an in-person evaluation if the symptoms worsen or if the condition fails to improve as anticipated.  [facility] MD OP Progress Note  01/17/2025 1:16 PM Susan Suarez  MRN:  [id number]  Chief Complaint:  Chief Complaint  Patient presents with   Follow-up   Depression   Anxiety   Medication Refill   HPI: Susan Suarez is a 39 year old Caucasian female, married, lives in [place] has a history of bipolar disorder, GAD, attention and concentration deficit, history of alcoholism, back pain was evaluated by telemedicine today for a follow-up appointment.  She reports stable mood without significant sadness, lack of motivation, or mood swings. She reports ongoing participation in weekly individual therapy and a weekly DBT skills class, and both she and her therapist have observed improvements in her functioning. She identifies recent work-related stress, including the need to fire an employee and temporarily take on additional job responsibilities, and states that she has managed these stressors well, expressing pride in her ability to remain strong and not become overly emotional. She reports that skills learned in her class,  particularly radical acceptance, have helped her cope with stress. She indicates that her husband provides significant support at home.  She continues to use Seroquel  100 mg at bedtime and Cymbalta  (duloxetine ) 60 mg once daily, Intuniv  1 mg along with ongoing therapy at [facility]. She confirms daily medication adherence and reports no bothersome side effects, noting that occasional twitching has not increased or become problematic.   She describes good sleep quality and reports that her pulmonologist discontinued her CPAP after she lost weight and practiced healthy sleep habits. Her child no longer disrupts her sleep at night. She reports good appetite, healthy dietary choices, and stable weight since the last visit.  She denies any suicidality, homicidality or perceptual disturbances.   Visit Diagnosis:    ICD-10-CM   1. Bipolar disorder, in full remission, most recent episode mixed  F31.78     2. GAD (generalized anxiety disorder)  F41.1 DULoxetine  (CYMBALTA ) 60 MG capsule    3. History of alcoholism (HCC)  F10.21       Past Psychiatric History: I have reviewed past psychiatric history from progress note on 08/20/2023.  Past trials of Zoloft , Ritalin, Concerta, Intuniv , duloxetine , Xanax  Past Medical History:  Past Medical History:  Diagnosis Date   ADHD    Allergy    Seasonal and surgical cleanser.   Anemia 2020   First Pregnancy   Anxiety    Depression    GDM, class A2 08/14/2022   Gestational diabetes     Past Surgical History:  Procedure Laterality Date   CESAREAN SECTION     CESAREAN SECTION N/A 10/12/2022   Procedure: CESAREAN SECTION;  Surgeon: Fredirick Merle  S, MD;  Location: [facility] LD [facility]S;  Service: Obstetrics;  Laterality: N/A;   COSMETIC SURGERY  2006   Mole removal on nose   WISDOM TOOTH EXTRACTION      Family Psychiatric History: I have reviewed family psychiatric history from progress note on 08/20/2023  Family History:  Family History  Problem  Relation Age of Onset   Arthritis Mother    Varicose Veins Mother    Alcohol abuse Father    Bipolar disorder Father    Diabetes Father    ADD / ADHD Father    Depression Father    Intellectual disability Sister    Varicose Veins Sister    Learning disabilities Sister    Cancer Maternal Aunt        cervical   Obesity Maternal Aunt    Varicose Veins Maternal Aunt    Cancer Maternal Uncle    Cancer Paternal Uncle    Hypertension Maternal Grandfather    Stroke Maternal Grandfather    Hypertension Maternal Grandmother    Heart disease Maternal Grandmother    Varicose Veins Maternal Grandmother    Bipolar disorder Paternal Grandfather    Hypertension Paternal Grandfather    Diabetes Paternal Grandfather    Heart disease Paternal Grandfather    Hypertension Paternal Grandmother    Bipolar disorder Other    Asthma Neg Hx     Social History: I have reviewed social history from progress note on 08/20/2023 Social History   Socioeconomic History   Marital status: Married    Spouse name: Not on file   Number of children: 2   Years of education: Not on file   Highest education level: Bachelor's degree (e.g., BA, AB, BS)  Occupational History   Not on file  Tobacco Use   Smoking status: Former    Current packs/day: 0.00    Average packs/day: 1.5 packs/day for 15.0 years (22.5 ttl pk-yrs)    Types: Cigarettes    Quit date: 09/21/2018    Years since quitting: 6.3    Passive exposure: Past   Smokeless tobacco: Never  Vaping Use   Vaping status: Never Used  Substance and Sexual Activity   Alcohol use: Yes    Alcohol/week: 3.0 - 5.0 standard drinks of alcohol    Types: 3 - 5 Glasses of wine per week   Drug use: Not Currently   Sexual activity: Yes    Birth control/protection: Pill  Other Topics Concern   Not on file  Social History Narrative   Not on file   Social Drivers of Health   Tobacco Use: Medium Risk (01/17/2025)   Patient History    Smoking Tobacco Use: Former     Smokeless Tobacco Use: Never    Passive Exposure: Past  Physicist, Medical Strain: Low Risk (02/25/2024)   Overall Financial Resource Strain (CARDIA)    Difficulty of Paying Living Expenses: Not hard at all  Food Insecurity: No Food Insecurity (02/25/2024)   Hunger Vital Sign    Worried About Running Out of Food in the Last Year: Never true    Ran Out of Food in the Last Year: Never true  Transportation Needs: No Transportation Needs (02/25/2024)   PRAPARE - Administrator, Civil Service (Medical): No    Lack of Transportation (Non-Medical): No  Physical Activity: Insufficiently Active (02/25/2024)   Exercise Vital Sign    Days of Exercise per Week: 2 days    Minutes of Exercise per Session: 10 min  Stress: Stress  Concern Present (02/25/2024)   Susan Suarez of Occupational Health - Occupational Stress Questionnaire    Feeling of Stress : Very much  Social Connections: Socially Isolated (02/25/2024)   Social Connection and Isolation Panel    Frequency of Communication with Friends and Family: Once a week    Frequency of Social Gatherings with Friends and Family: Once a week    Attends Religious Services: Never    Database Administrator or Organizations: No    Attends Engineer, Structural: Not on file    Marital Status: Married  Depression (PHQ2-9): Low Risk (01/17/2025)   Depression (PHQ2-9)    PHQ-2 Score: 0  Alcohol Screen: Medium Risk (02/25/2024)   Alcohol Screen    Last Alcohol Screening Score (AUDIT): 9  Housing: Low Risk (02/25/2024)   Housing Stability Vital Sign    Unable to Pay for Housing in the Last Year: No    Number of Times Moved in the Last Year: 1    Homeless in the Last Year: No  Utilities: Low Risk (01/15/2024)   Received from [facility]   Utilities    In the past 12 months has the electric, gas, oil, or water company threatened to shut off services in your home? : No  Health Literacy: Not on file    Allergies: Allergies[1]  Metabolic  Disorder Labs: Lab Results  Component Value Date   HGBA1C 5.8 (H) 02/25/2024   MPG 116.89 08/20/2023   No results found for: PROLACTIN Lab Results  Component Value Date   CHOL 160 02/25/2024   TRIG 117 02/25/2024   HDL 44 02/25/2024   CHOLHDL 3.6 02/25/2024   VLDL 10 08/20/2023   LDLCALC 95 02/25/2024   LDLCALC 103 (H) 08/20/2023   Lab Results  Component Value Date   TSH 0.627 02/25/2024   TSH 1.373 08/20/2023    Therapeutic Level Labs: No results found for: LITHIUM No results found for: VALPROATE No results found for: CBMZ  Current Medications: Current Outpatient Medications  Medication Sig Dispense Refill   docusate sodium  (COLACE) 100 MG capsule Take 1 capsule (100 mg total) by mouth daily as needed. 30 capsule 2   Drospirenone  (SLYND ) 4 MG TABS Take 1 tablet (4 mg total) by mouth daily. 84 tablet 5   DULoxetine  (CYMBALTA ) 60 MG capsule Take 1 capsule (60 mg total) by mouth daily. 90 capsule 1   fexofenadine (ALLEGRA) 180 MG tablet Take 180 mg by mouth daily as needed for allergies.     fluticasone (FLONASE) 50 MCG/ACT nasal spray Place into both nostrils as needed.     guanFACINE  (INTUNIV ) 1 MG TB24 ER tablet Take 1 tablet (1 mg total) by mouth daily. 90 tablet 1   hydrOXYzine  (ATARAX ) 25 MG tablet TAKE 1 TABLET BY MOUTH 3 TIMES DAILY AS NEEDED FOR ANXIETY. 270 tablet 0   Multiple Vitamin (MULTIVITAMIN) tablet Take 1 tablet by mouth as needed.     OVER THE COUNTER MEDICATION once. Zenium Tranquility Blend     QUEtiapine  (SEROQUEL ) 100 MG tablet Take 1 tablet (100 mg total) by mouth at bedtime. 30 tablet 2   No current facility-administered medications for this visit.     Musculoskeletal: Strength & Muscle Tone: UTA Gait & Station: Seated Patient leans: N/A  Psychiatric Specialty Exam: Review of Systems  Psychiatric/Behavioral: Negative.      There were no vitals taken for this visit.There is no height or weight on file to calculate BMI.  General  Appearance: Casual  Eye  Contact:  Fair  Speech:  Clear and Coherent  Volume:  Normal  Mood:  Euthymic  Affect:  Congruent  Thought Process:  Goal Directed and Descriptions of Associations: Intact  Orientation:  Full (Time, Place, and Person)  Thought Content: Logical   Suicidal Thoughts:  No  Homicidal Thoughts:  No  Memory:  Immediate;   Fair Recent;   Fair Remote;   Fair  Judgement:  Fair  Insight:  Fair  Psychomotor Activity:  Normal  Concentration:  Concentration: Fair and Attention Span: Fair  Recall:  [employer] of Knowledge: Fair  Language: Fair  Akathisia:  No  Handed:  Right  AIMS (if indicated): not done  Assets:  Communication Skills Desire for Improvement Housing Intimacy Social Support Transportation  ADL's:  Intact  Cognition: WNL  Sleep:  fair   Screenings: Geneticist, Molecular Office Visit from 08/17/2024 in [facility]  AIMS Total Score 0   AUDIT    Flowsheet Row Office Visit from 02/25/2024 in [facility] at [facility]  Alcohol Use Disorder Identification Test Final Score (AUDIT) 9    GAD-7    Flowsheet Row Office Visit from 08/17/2024 in [facility] Office Visit from 02/25/2024 in [facility] & [facility] Medicine at [facility] Office Visit from 12/30/2023 in [facility] for [facility] at [facility] Office Visit from 08/20/2023 in [facility] Office Visit from 07/22/2023 in [facility] & [facility] Medicine at [facility]  Total GAD-7 Score 10 11 8 18 12    PHQ2-9    Flowsheet Row Video Visit from 01/17/2025 in [facility] Office Visit from 08/17/2024 in [facility] Office Visit from 02/25/2024 in [facility] Primary Care & [facility] Medicine at [facility] Office Visit from  12/30/2023 in [facility] for [facility] at [facility] Office Visit from 08/20/2023 in [facility]  PHQ-2 Total Score 0 2 2 2 3   PHQ-9 Total Score -- 8 11 14 20    Flowsheet Row Video Visit from 01/17/2025 in [facility] Video Visit from 11/08/2024 in [facility] Office Visit from 08/17/2024 in [facility]  C-SSRS RISK CATEGORY No Risk No Risk Low Risk     Assessment and Plan: ESTHELA BRANDNER is a 39 year old Caucasian female who presented for a follow-up appointment, discussed assessment and plan as noted below.  1. Bipolar disorder, in full remission, most recent episode mixed Currently reports overall mood symptoms are stable Continue Seroquel  100 mg at bedtime Continue Cymbalta  60 mg daily  2. GAD (generalized anxiety disorder)-stable Currently denies any significant concerns Continue CBT/DBT skill classes at [facility] counseling and individual therapy Continue Cymbalta  as prescribed Continue Hydroxyzine  25 mg 3 times a day as needed Continue Intuniv  1 mg extended release for attention and focus problems.  3. History of alcoholism (HCC) Will monitor closely  Follow-up Follow-up in clinic in 2 to 3 months or sooner in person.  Collaboration of Care: Collaboration of Care: Referral or follow-up with counselor/therapist AEB encouraged to continue psychotherapy sessions  Patient/Guardian was advised Release of Information must be obtained prior to any record release in order to collaborate their care with an outside provider. Patient/Guardian was advised if they have not already done so to  contact the registration department to sign all necessary forms in order for us  to release information regarding their care.   Consent: Patient/Guardian gives verbal consent for treatment and assignment of benefits for  services provided during this visit. Patient/Guardian expressed understanding and agreed to proceed.  Discussed the use of a AI scribe software for clinical note transcription with the patient, who gave verbal consent to proceed.  This note was generated in part or whole with voice recognition software. Voice recognition is usually quite accurate but there are transcription errors that can and very often do occur. I apologize for any typographical errors that were not detected and corrected.     Markevion Lattin, MD 01/17/2025, 1:16 PM     [1]  Allergies Allergen Reactions   Chlorhexidine Gluconate Rash    wash

## 2025-04-06 ENCOUNTER — Ambulatory Visit: Admitting: Psychiatry
